# Patient Record
Sex: Male | Born: 1935 | Race: White | Hispanic: No | Marital: Married | Smoking: Former smoker
Health system: Southern US, Community
[De-identification: ages and names within clinical notes are randomized; demographics above are authoritative.]

## PROBLEM LIST (undated history)

## (undated) DIAGNOSIS — M1990 Unspecified osteoarthritis, unspecified site: Secondary | ICD-10-CM

## (undated) DIAGNOSIS — N486 Induration penis plastica: Secondary | ICD-10-CM

## (undated) DIAGNOSIS — K5792 Diverticulitis of intestine, part unspecified, without perforation or abscess without bleeding: Secondary | ICD-10-CM

## (undated) DIAGNOSIS — C61 Malignant neoplasm of prostate: Secondary | ICD-10-CM

## (undated) HISTORY — PX: PROSTATE BIOPSY: SHX241

## (undated) HISTORY — PX: SPINE SURGERY: SHX786

## (undated) HISTORY — PX: TONSILLECTOMY: SUR1361

## (undated) HISTORY — PX: OTHER SURGICAL HISTORY: SHX169

---

## 1999-12-08 ENCOUNTER — Encounter: Payer: Self-pay | Admitting: Gastroenterology

## 1999-12-08 ENCOUNTER — Encounter: Admission: RE | Admit: 1999-12-08 | Discharge: 1999-12-08 | Payer: Self-pay | Admitting: Gastroenterology

## 2004-01-27 HISTORY — PX: HERNIA REPAIR: SHX51

## 2004-02-19 ENCOUNTER — Ambulatory Visit (HOSPITAL_COMMUNITY): Admission: RE | Admit: 2004-02-19 | Discharge: 2004-02-19 | Payer: Self-pay | Admitting: Gastroenterology

## 2004-03-26 ENCOUNTER — Ambulatory Visit (HOSPITAL_BASED_OUTPATIENT_CLINIC_OR_DEPARTMENT_OTHER): Admission: RE | Admit: 2004-03-26 | Discharge: 2004-03-26 | Payer: Self-pay | Admitting: Surgery

## 2005-12-30 ENCOUNTER — Ambulatory Visit (HOSPITAL_COMMUNITY): Admission: RE | Admit: 2005-12-30 | Discharge: 2005-12-30 | Payer: Self-pay | Admitting: Neurosurgery

## 2008-02-02 ENCOUNTER — Encounter: Admission: RE | Admit: 2008-02-02 | Discharge: 2008-02-02 | Payer: Self-pay | Admitting: Gastroenterology

## 2008-12-21 ENCOUNTER — Encounter: Admission: RE | Admit: 2008-12-21 | Discharge: 2008-12-21 | Payer: Self-pay | Admitting: Sports Medicine

## 2009-02-11 ENCOUNTER — Encounter: Admission: RE | Admit: 2009-02-11 | Discharge: 2009-02-11 | Payer: Self-pay | Admitting: Sports Medicine

## 2009-05-02 ENCOUNTER — Encounter: Admission: RE | Admit: 2009-05-02 | Discharge: 2009-05-02 | Payer: Self-pay | Admitting: Sports Medicine

## 2009-09-16 ENCOUNTER — Encounter: Admission: RE | Admit: 2009-09-16 | Discharge: 2009-09-16 | Payer: Self-pay | Admitting: Sports Medicine

## 2010-06-13 NOTE — Op Note (Signed)
NAMEDAMONIE, ELLENWOOD NO.:  [id number]   MEDICAL RECORD NO.:  [id number]          PATIENT TYPE:  AMB   LOCATION:  DSC                          FACILITY:  [facility]   PHYSICIAN:  Thornton Park. Daphine Deutscher, MD  DATE OF BIRTH:  1935-03-25   DATE OF PROCEDURE:  03/26/2004  DATE OF DISCHARGE:                                 OPERATIVE REPORT   CCS #[id number]   PREOPERATIVE DIAGNOSIS:  Right inguinal hernia.   POSTOPERATIVE DIAGNOSIS:  Right indirect inguinal hernia.   OPERATION PERFORMED:  Right inguinal herniorrhaphy with mesh and high  ligation of sac.   SURGEON:  Thornton Park. Daphine Deutscher, MD   ANESTHESIA:  General.   DESCRIPTION OF PROCEDURE:  Arieh Bogue is a 75 year old gentleman taken to  room 3 at [facility] on the afternoon on March 26, 2004.  He was given general anesthesia.  The abdomen was clipped and prepped with  Betadine and draped sterilely.  An oblique incision was made and carried  down through the subcutaneous tissue to the external oblique which was  incised along the fibers to the external ring.  The cord was mobilized and a  Penrose placed about it.  I dissected proximally.  He had a lot of fat and a  lot of inflammation around his cord structures but I found anteromedially, a  fairly large indirect sac which I dissected from the cord structures and  performed a high ligation with running horizontal mattress sutures of silk.  I cut off the excess sac and allowed it to reduce.  The floor was repaired  with a piece of mesh, Marlex type polypropylene, cut to fit and sutured  along the inguinal ligament with a running 2-0 Prolene.  Medially it was  sutured with a 2-0 Prolene and then it was cut and split to create a new  internal ring suturing itself with horizontal mattresses of 2-0 Prolene.  These were then tucked beneath the external oblique.  While doing this, I  had excluded the ilioinguinal nerve branches.  Two were lifted and placed up  with  the superior flap.  I then allowed these to fall back down into this  region and then the external oblique was closed with running 2-0 Vicryl.  4-  0 Vicryl was used in the subcutaneous tissue in interrupted fashion. Then  the skin was approximated with running subcuticular 5-0 Vicryl with benzoin  and Steri-Strips.  The patient seemed to tolerate the procedure well.  He  was given Tylox for pain and will be followed up in the office in about  three weeks.   CONDITION:  Good.   FINAL DIAGNOSIS:  Right indirect inguinal hernia.      MBM/MEDQ  D:  03/26/2004  T:  03/27/2004  Job:  [id number]   cc:   Danise Edge, M.D.  [address]  [place]  [place] [address]  Fax: [phone]

## 2010-06-13 NOTE — Op Note (Signed)
Brian Hardy, MILLES NO.:  [id number]   MEDICAL RECORD NO.:  [id number]          PATIENT TYPE:  AMB   LOCATION:  ENDO                         FACILITY:  [facility]   PHYSICIAN:  Danise Edge, M.D.   DATE OF BIRTH:  01/21/1936   DATE OF PROCEDURE:  02/19/2004  DATE OF DISCHARGE:                                 OPERATIVE REPORT   PROCEDURE:  Screening colonoscopy.   PROCEDURE INDICATIONS:  Ms. Carle Fenech is a 75 year old male born August 24, 1935.  Mr. Driskill has a symptomatic right inguinal hernia which will  need surgical repair.  He has a history of diverticulitis.  His Jun 21, 1997  proctocolonoscopy to the cecum revealed extensive left colonic  diverticulosis without colorectal neoplasia.   MEDICATION ALLERGIES:  SULFA.   CHRONIC MEDICATIONS:  None.   PAST MEDICAL HISTORY:  Left colonic diverticulosis complicated by  diverticulitis.   PAST SURGICAL HISTORY:  None.   FAMILY HISTORY:  Negative for colon cancer and prostate cancer.   HABITS:  Mr. Jolliff quit smoking cigarettes 20 years ago.  He does not  consume alcohol to excess.   ENDOSCOPIST:  Danise Edge, M.D.   PREMEDICATION:  1.  Versed 7 mg.  2.  Demerol 60 mg.   PROCEDURE:  After obtaining informed consent, Mr. Watling was placed in the  left lateral decubitus position.  I administered intravenous Demerol and  intravenous Versed to achieve conscious sedation for the procedure.  The  patient's blood pressure, oxygen saturation, and cardiac rhythm were  monitored throughout the procedure and documented in the medical record.   Anal inspection and digital rectal exam were normal.  The prostate was  nonnodular. The Olympus adjustable pediatric colonoscope was introduced into  the rectum and advanced to the cecum.  Colonic preparation for the exam  today was excellent.   Rectum normal.   Sigmoid colon and descending colon.  Extensive left colonic diverticulosis.   Splenic flexure  normal.   Transverse colon normal.   Hepatic flexure normal.   Ascending colon normal.   Cecum and ileocecal valve normal.   ASSESSMENT:  Extensive left colonic diverticulosis; otherwise normal  screening proctocolonoscopy to the cecum.   RECOMMENDATIONS:  I will schedule Mr. Weil an appointment with Dr. Luretha Murphy to repair his symptomatic right inguinal hernia.      MJ/MEDQ  D:  02/19/2004  T:  02/19/2004  Job:  [id number]   cc:   Thornton Park Daphine Deutscher, MD  [address], [address]  [place]  [place] [address]  Fax: [phone]

## 2010-06-13 NOTE — Op Note (Signed)
Brian Hardy, Brian Hardy                ACCOUNT NO.:  [id number]   MEDICAL RECORD NO.:  [id number]          PATIENT TYPE:  AMB   LOCATION:  SDS                          FACILITY:  [facility]   PHYSICIAN:  Henry A. Pool, M.D.    DATE OF BIRTH:  [birth year]   DATE OF PROCEDURE:  12/30/2005  DATE OF DISCHARGE:                               OPERATIVE REPORT   PREOPERATIVE DIAGNOSIS:  Left L5-S1 herniated nucleus pulposus with  radiculopathy.   POSTOPERATIVE DIAGNOSIS:  Left L5-S1 herniated nucleus pulposus with  radiculopathy.   PROCEDURE NOTE:  Left L5-S1 laminotomy and microdiskectomy.   SURGEON:  Kathaleen Maser. Pool, M.D.   ASSISTANT:  Reinaldo Meeker, M.D.   ANESTHESIA:  General.   INDICATIONS:  [name] is a 75 year old male with history of severe  left lower extremity pain, paresthesias, weakness consistent left-sided  S1 radiculopathy.  The patient has failed conservative management.  Workup demonstrates evidence of a large left-sided L5-S1 disk herniation  with compression left-sided S1 nerve root.  The patient has decided to  proceed with lumbar microdiskectomy in hopes improving symptoms.   OPERATIVE NOTE:  Patient taken operating room placed on table in supine  position.  After adequate level of anesthesia was achieved, the patient  prone onto Wilson frame appropriately padded.  The patient lumbar region  prepped and draped sterilely.  A 10 blade used to make linear incision  overlying the L5-S1 interspace.  This carried down sharply in the  midline.  Subperiosteal section performed exposing lamina facet joints  L5-S1 on the left side.  Deep self-retaining retractor placed.  Intraoperative x-rays taken, level was confirmed.  Laminotomy then  performed using high-speed drill and Kerrison rongeurs to remove  inferior aspect lamina of L5, medial aspect of L5-S1 facet joint and the  superior rim of the S1 lamina.  Ligament flavum was elevated and  resected piecemeal fashion using  Kerrison rongeurs.  Underlying thecal  sac and exiting S1 nerve root were identified.  Microscope then brought  to field, used microdissection left side S1 nerve root, underlying disk  __________ epidural venous plexus coagulated and cut.  Left-sided S1  nerve root was gently mobilized, retracted towards midline.  A large  disk herniation with the inferior free fragment was encountered and  completely resected.  Disk space then incised 15 blade in rectangular  fashion.  Wide disk space clean-out was then achieved using pituitary  rongeurs, upward angled pituitary rongeurs and Epstein curettes.  All  elements the disk herniation completely resected.  All loose obviously  degenerative disk material removed from  the interspace.  This point a  very thorough decompression was achieved.  There is no evidence of  injury to thecal sac or nerve roots.  There is no evidence of any  residual disk herniation present.  Wound was then irrigated antibiotic  solution.  Gelfoam was placed topically for hemostasis, found to be  good.  Microscope  and retractor system were removed.  Hemostasis muscle achieved with  electrocautery. Wound was closed in layers with Vicryl suture.  Steri-  Strips, sterile dressings  were applied.  There no complications.  The  patient well and he returns to recovery room postoperatively.           ______________________________  Kathaleen Maser Pool, M.D.     HAP/MEDQ  D:  12/30/2005  T:  12/30/2005  Job:  [id number]

## 2010-09-03 ENCOUNTER — Other Ambulatory Visit: Payer: Self-pay | Admitting: Gastroenterology

## 2010-09-03 ENCOUNTER — Ambulatory Visit
Admission: RE | Admit: 2010-09-03 | Discharge: 2010-09-03 | Disposition: A | Discharge status: Home / Self Care | Payer: BC Managed Care – PPO | Source: Ambulatory Visit | Attending: Gastroenterology | Admitting: Gastroenterology

## 2010-10-02 ENCOUNTER — Ambulatory Visit (HOSPITAL_COMMUNITY)
Admission: RE | Admit: 2010-10-02 | Discharge: 2010-10-02 | Disposition: A | Discharge status: Home / Self Care | Payer: BC Managed Care – PPO | Source: Ambulatory Visit | Attending: Gastroenterology | Admitting: Gastroenterology

## 2010-10-02 ENCOUNTER — Other Ambulatory Visit: Payer: Self-pay | Admitting: Gastroenterology

## 2010-10-02 DIAGNOSIS — Z79899 Other long term (current) drug therapy: Secondary | ICD-10-CM | POA: Insufficient documentation

## 2010-10-02 DIAGNOSIS — R12 Heartburn: Secondary | ICD-10-CM | POA: Insufficient documentation

## 2010-10-02 DIAGNOSIS — N400 Enlarged prostate without lower urinary tract symptoms: Secondary | ICD-10-CM | POA: Insufficient documentation

## 2010-10-02 DIAGNOSIS — R1310 Dysphagia, unspecified: Secondary | ICD-10-CM | POA: Insufficient documentation

## 2010-10-05 NOTE — Op Note (Signed)
  NAMESAYER, MASINI NO.:  [id number]  MEDICAL RECORD NO.:  [id number]  LOCATION:  WLEN                         FACILITY:  [facility]  PHYSICIAN:  Danise Edge, M.D.   DATE OF BIRTH:  16-Jul-1935  DATE OF PROCEDURE:  10/02/2010 DATE OF DISCHARGE:                              OPERATIVE REPORT   PROCEDURE:  Diagnostic esophagogastroduodenoscopy.  HISTORY:  Mr. Brian Hardy is a 75 year old male born on 10/07/1935.  The patient has chronic, intermittent heartburn, and chronic, intermittent esophageal dysphagia.  He denies odynophagia, nausea, vomiting, or weight loss.  His barium esophagram with barium tablet was normal.  ENDOSCOPIST:  Danise Edge, MD  PREMEDICATION:  Fentanyl 75 mcg, Versed 5 mg.  PROCEDURE IN DETAIL:  The patient was placed in the left lateral decubitus position.  The Pentax gastroscope was passed through the posterior hypopharynx into the proximal esophagus without difficulty.The hypopharynx, larynx, and vocal cords appeared normal.  Esophagoscopy:  The proximal mid and lower segments of the esophageal mucosa appeared completely normal.  The squamocolumnar junction is noted at 40 cm from the incisor teeth.  There is no endoscopic evidence for the presence of erosive esophagitis or Barrett esophagus.  There is no esophageal obstruction or esophageal stricture formation.  Gastroscopy:  Retroflex view of the gastric cardia and fundus was normal.  The gastric body, antrum, and pylorus appeared normal.  Duodenoscopy:  The duodenal bulb and descending duodenum appeared normal.  Esophageal biopsies:  Random esophageal biopsies were performed from normal-appearing esophageal mucosa to look for eosinophilic esophagitis.  ASSESSMENT:  Normal esophagogastroduodenoscopy.  Esophageal biopsies to rule out eosinophilic esophagitis pending.         ______________________________ Danise Edge, M.D.    MJ/MEDQ  D:  10/02/2010  T:   10/02/2010  Job:  [id number]  Electronically Signed by Danise Edge M.D. on 10/05/2010 09:14:01 AM

## 2011-01-27 HISTORY — PX: INGUINAL HERNIA REPAIR: SUR1180

## 2011-01-29 DIAGNOSIS — N529 Male erectile dysfunction, unspecified: Secondary | ICD-10-CM | POA: Diagnosis not present

## 2011-01-29 DIAGNOSIS — N400 Enlarged prostate without lower urinary tract symptoms: Secondary | ICD-10-CM | POA: Diagnosis not present

## 2011-01-29 DIAGNOSIS — R972 Elevated prostate specific antigen [PSA]: Secondary | ICD-10-CM | POA: Diagnosis not present

## 2011-06-29 DIAGNOSIS — R1310 Dysphagia, unspecified: Secondary | ICD-10-CM | POA: Diagnosis not present

## 2011-10-20 DIAGNOSIS — Z23 Encounter for immunization: Secondary | ICD-10-CM | POA: Diagnosis not present

## 2011-11-12 DIAGNOSIS — R351 Nocturia: Secondary | ICD-10-CM | POA: Diagnosis not present

## 2011-12-10 DIAGNOSIS — R351 Nocturia: Secondary | ICD-10-CM | POA: Diagnosis not present

## 2011-12-10 DIAGNOSIS — K4090 Unilateral inguinal hernia, without obstruction or gangrene, not specified as recurrent: Secondary | ICD-10-CM | POA: Diagnosis not present

## 2011-12-10 DIAGNOSIS — R3913 Splitting of urinary stream: Secondary | ICD-10-CM | POA: Diagnosis not present

## 2011-12-22 ENCOUNTER — Ambulatory Visit (INDEPENDENT_AMBULATORY_CARE_PROVIDER_SITE_OTHER): Payer: BC Managed Care – PPO | Admitting: Surgery

## 2011-12-22 ENCOUNTER — Encounter (INDEPENDENT_AMBULATORY_CARE_PROVIDER_SITE_OTHER): Payer: Self-pay | Admitting: Surgery

## 2011-12-22 VITALS — BP 122/88 | HR 62 | Temp 97.6°F | Ht 67.0 in | Wt 152.2 lb

## 2011-12-22 DIAGNOSIS — K4090 Unilateral inguinal hernia, without obstruction or gangrene, not specified as recurrent: Secondary | ICD-10-CM | POA: Insufficient documentation

## 2011-12-22 NOTE — Progress Notes (Signed)
Patient ID: Brian Hardy, male   DOB: August 07, 1935, 76 y.o.   MRN: [id number]  Chief Complaint  Patient presents with  . Pre-op Exam    eval hernia    HPI Brian Hardy is a 76 y.o. male.   HPI This gentleman is a self referral. He has a symptomatic left inguinal hernia. He had a right inguinal hernia repair with mesh in 2006 here by Dr. Daphine Hardy. He has mild pain in the right groin. He has no nausea or vomiting. He reports that the hernia always easily reduces. He noticed it only about a week or so ago History reviewed. No pertinent past medical history.  Past Surgical History  Procedure Date  . Hernia repair 2006  . Spine surgery 2009-2010    Family History  Problem Relation Age of Onset  . Dementia Mother   . Pneumonia Father     Social History History  Substance Use Topics  . Smoking status: Former Smoker    Quit date: 12/21/1984  . Smokeless tobacco: Not on file  . Alcohol Use: Yes     Comment: social    Allergies  Allergen Reactions  . Sulfa Antibiotics Hives and Swelling    Current Outpatient Prescriptions  Medication Sig Dispense Refill  . finasteride (PROSCAR) 5 MG tablet         Review of Systems Review of Systems  Constitutional: Negative for fever, chills and unexpected weight change.  HENT: Negative for hearing loss, congestion, sore throat, trouble swallowing and voice change.   Eyes: Negative for visual disturbance.  Respiratory: Negative for cough and wheezing.   Cardiovascular: Negative for chest pain, palpitations and leg swelling.  Gastrointestinal: Negative for nausea, vomiting, abdominal pain, diarrhea, constipation, blood in stool, abdominal distention, anal bleeding and rectal pain.  Genitourinary: Negative for hematuria and difficulty urinating.  Musculoskeletal: Negative for arthralgias.  Skin: Negative for rash and wound.  Neurological: Negative for seizures, syncope, weakness and headaches.  Hematological: Negative for adenopathy.  Does not bruise/bleed easily.  Psychiatric/Behavioral: Negative for confusion.    Blood pressure 122/88, pulse 62, temperature 97.6 F (36.4 C), temperature source Temporal, height 5\' 7"  (1.702 m), weight 152 lb 3.2 oz (69.037 kg), SpO2 97.00%.  Physical Exam Physical Exam  Constitutional: He is oriented to person, place, and time. He appears well-developed and well-nourished. No distress.  HENT:  Head: Normocephalic and atraumatic.  Right Ear: External ear normal.  Left Ear: External ear normal.  Nose: Nose normal.  Mouth/Throat: Oropharynx is clear and moist. No oropharyngeal exudate.  Eyes: Conjunctivae normal are normal. Pupils are equal, round, and reactive to light. Right eye exhibits no discharge. Left eye exhibits no discharge. No scleral icterus.  Neck: Normal range of motion. Neck supple. No tracheal deviation present. No thyromegaly present.  Cardiovascular: Normal rate, regular rhythm, normal heart sounds and intact distal pulses.   No murmur heard. Pulmonary/Chest: Effort normal and breath sounds normal. No respiratory distress. He has no wheezes. He has no rales.  Abdominal: Soft. Bowel sounds are normal. He exhibits no distension. There is no tenderness.       There is a well-healed right inguinal incision with no evidence of recurrent hernia. There is a small, easily reducible left inguinal hernia  Musculoskeletal: Normal range of motion. He exhibits no edema and no tenderness.  Lymphadenopathy:    He has no cervical adenopathy.  Neurological: He is alert and oriented to person, place, and time.  Skin: Skin is warm and dry. No  rash noted. He is not diaphoretic. No erythema.  Psychiatric: His behavior is normal. Judgment normal.    Data Reviewed   Assessment    Left inguinal hernia    Plan    Repair with mesh was recommended. I discussed this with him in detail. I discussed the risks of surgery which includes but is not limited to bleeding, infection, nerve  entrapment, chronic pain, recurrence, etc. He understands and wishes to proceed. Surgery will be scheduled. Likelihood of success is good       Brian Hardy A 12/22/2011, 10:57 AM

## 2012-01-07 DIAGNOSIS — K4090 Unilateral inguinal hernia, without obstruction or gangrene, not specified as recurrent: Secondary | ICD-10-CM | POA: Diagnosis not present

## 2012-01-08 ENCOUNTER — Encounter (INDEPENDENT_AMBULATORY_CARE_PROVIDER_SITE_OTHER): Payer: Self-pay | Admitting: General Surgery

## 2012-02-01 ENCOUNTER — Encounter (INDEPENDENT_AMBULATORY_CARE_PROVIDER_SITE_OTHER): Payer: Self-pay | Admitting: Surgery

## 2012-02-01 ENCOUNTER — Ambulatory Visit (INDEPENDENT_AMBULATORY_CARE_PROVIDER_SITE_OTHER): Payer: BC Managed Care – PPO | Admitting: Surgery

## 2012-02-01 VITALS — BP 132/68 | HR 60 | Temp 97.7°F | Resp 12 | Ht 68.0 in | Wt 152.2 lb

## 2012-02-01 DIAGNOSIS — Z09 Encounter for follow-up examination after completed treatment for conditions other than malignant neoplasm: Secondary | ICD-10-CM

## 2012-02-01 NOTE — Progress Notes (Signed)
Subjective:     Patient ID: Brian Hardy, male   DOB: Feb 27, 1935, 76 y.o.   MRN: [id number]  HPI He is here for his first postoperative visit status post left inguinal hernia repair with mesh. He is doing well and has no complaints.  Review of Systems     Objective:   Physical Exam On exam, there is mild postoperative swelling but no evidence of infection or recurrence    Assessment:     Patient stable postop    Plan:     He will refrain from heavy lifting for one more week. I will see him back as needed

## 2012-02-08 DIAGNOSIS — N35919 Unspecified urethral stricture, male, unspecified site: Secondary | ICD-10-CM | POA: Diagnosis not present

## 2012-02-08 DIAGNOSIS — N486 Induration penis plastica: Secondary | ICD-10-CM | POA: Diagnosis not present

## 2012-02-08 DIAGNOSIS — N529 Male erectile dysfunction, unspecified: Secondary | ICD-10-CM | POA: Diagnosis not present

## 2012-02-11 ENCOUNTER — Encounter (INDEPENDENT_AMBULATORY_CARE_PROVIDER_SITE_OTHER): Payer: Self-pay | Admitting: General Surgery

## 2012-02-11 ENCOUNTER — Telehealth (INDEPENDENT_AMBULATORY_CARE_PROVIDER_SITE_OTHER): Payer: Self-pay | Admitting: General Surgery

## 2012-02-11 NOTE — Telephone Encounter (Signed)
RTW letter generated removing restrictions and FAX to pt.

## 2012-04-08 DIAGNOSIS — N35919 Unspecified urethral stricture, male, unspecified site: Secondary | ICD-10-CM | POA: Diagnosis not present

## 2012-04-08 DIAGNOSIS — N486 Induration penis plastica: Secondary | ICD-10-CM | POA: Diagnosis not present

## 2012-06-12 ENCOUNTER — Other Ambulatory Visit (INDEPENDENT_AMBULATORY_CARE_PROVIDER_SITE_OTHER): Payer: Self-pay | Admitting: Surgery

## 2012-06-15 DIAGNOSIS — N35919 Unspecified urethral stricture, male, unspecified site: Secondary | ICD-10-CM | POA: Diagnosis not present

## 2012-06-15 DIAGNOSIS — N529 Male erectile dysfunction, unspecified: Secondary | ICD-10-CM | POA: Diagnosis not present

## 2012-06-15 DIAGNOSIS — N486 Induration penis plastica: Secondary | ICD-10-CM | POA: Diagnosis not present

## 2012-07-27 DIAGNOSIS — N400 Enlarged prostate without lower urinary tract symptoms: Secondary | ICD-10-CM | POA: Diagnosis not present

## 2012-11-13 DIAGNOSIS — Z23 Encounter for immunization: Secondary | ICD-10-CM | POA: Diagnosis not present

## 2013-06-21 DIAGNOSIS — N486 Induration penis plastica: Secondary | ICD-10-CM | POA: Diagnosis not present

## 2013-06-21 DIAGNOSIS — R972 Elevated prostate specific antigen [PSA]: Secondary | ICD-10-CM | POA: Diagnosis not present

## 2013-06-21 DIAGNOSIS — N400 Enlarged prostate without lower urinary tract symptoms: Secondary | ICD-10-CM | POA: Diagnosis not present

## 2013-08-09 ENCOUNTER — Other Ambulatory Visit: Payer: Self-pay | Admitting: Gastroenterology

## 2013-09-14 ENCOUNTER — Encounter (HOSPITAL_COMMUNITY): Payer: Self-pay | Admitting: Pharmacy Technician

## 2013-09-22 ENCOUNTER — Encounter (HOSPITAL_COMMUNITY): Payer: Self-pay | Admitting: *Deleted

## 2013-10-02 NOTE — Anesthesia Preprocedure Evaluation (Addendum)
Anesthesia Evaluation  Patient identified by MRN, date of birth, ID band Patient awake    Reviewed: Allergy & Precautions, H&P , NPO status , Patient's Chart, lab work & pertinent test results  History of Anesthesia Complications Negative for: history of anesthetic complications  Airway Mallampati: II TM Distance: >3 FB Neck ROM: full    Dental no notable dental hx.    Pulmonary former smoker,  breath sounds clear to auscultation  Pulmonary exam normal       Cardiovascular Exercise Tolerance: Good negative cardio ROS  Rhythm:regular Rate:Normal     Neuro/Psych negative neurological ROS  negative psych ROS   GI/Hepatic negative GI ROS, Neg liver ROS,   Endo/Other  negative endocrine ROS  Renal/GU negative Renal ROS  negative genitourinary   Musculoskeletal   Abdominal   Peds  Hematology negative hematology ROS (+)   Anesthesia Other Findings   Reproductive/Obstetrics negative OB ROS                          Anesthesia Physical Anesthesia Plan  ASA: II  Anesthesia Plan: MAC   Post-op Pain Management:    Induction: Intravenous  Airway Management Planned:   Additional Equipment:   Intra-op Plan:   Post-operative Plan:   Informed Consent: I have reviewed the patients History and Physical, chart, labs and discussed the procedure including the risks, benefits and alternatives for the proposed anesthesia with the patient or authorized representative who has indicated his/her understanding and acceptance.   Dental Advisory Given and Dental advisory given  Plan Discussed with: CRNA  Anesthesia Plan Comments:        Anesthesia Quick Evaluation

## 2013-10-03 ENCOUNTER — Ambulatory Visit (HOSPITAL_COMMUNITY): Payer: BC Managed Care – PPO | Admitting: Anesthesiology

## 2013-10-03 ENCOUNTER — Encounter (HOSPITAL_COMMUNITY): Payer: BC Managed Care – PPO | Admitting: Anesthesiology

## 2013-10-03 ENCOUNTER — Ambulatory Visit (HOSPITAL_COMMUNITY)
Admission: RE | Admit: 2013-10-03 | Discharge: 2013-10-03 | Disposition: A | Discharge status: Home / Self Care | Payer: BC Managed Care – PPO | Source: Ambulatory Visit | Attending: Gastroenterology | Admitting: Gastroenterology

## 2013-10-03 ENCOUNTER — Encounter (HOSPITAL_COMMUNITY): Payer: Self-pay | Admitting: *Deleted

## 2013-10-03 ENCOUNTER — Encounter (HOSPITAL_COMMUNITY)
Admission: RE | Disposition: A | Discharge status: Home / Self Care | Payer: Self-pay | Source: Ambulatory Visit | Attending: Gastroenterology

## 2013-10-03 DIAGNOSIS — Z882 Allergy status to sulfonamides status: Secondary | ICD-10-CM | POA: Insufficient documentation

## 2013-10-03 DIAGNOSIS — K219 Gastro-esophageal reflux disease without esophagitis: Secondary | ICD-10-CM | POA: Diagnosis not present

## 2013-10-03 DIAGNOSIS — K5730 Diverticulosis of large intestine without perforation or abscess without bleeding: Secondary | ICD-10-CM | POA: Insufficient documentation

## 2013-10-03 DIAGNOSIS — Z8719 Personal history of other diseases of the digestive system: Secondary | ICD-10-CM | POA: Diagnosis not present

## 2013-10-03 DIAGNOSIS — K7460 Unspecified cirrhosis of liver: Secondary | ICD-10-CM | POA: Insufficient documentation

## 2013-10-03 DIAGNOSIS — Z1211 Encounter for screening for malignant neoplasm of colon: Secondary | ICD-10-CM | POA: Insufficient documentation

## 2013-10-03 DIAGNOSIS — Z888 Allergy status to other drugs, medicaments and biological substances status: Secondary | ICD-10-CM | POA: Diagnosis not present

## 2013-10-03 DIAGNOSIS — N400 Enlarged prostate without lower urinary tract symptoms: Secondary | ICD-10-CM | POA: Diagnosis not present

## 2013-10-03 HISTORY — PX: COLONOSCOPY WITH PROPOFOL: SHX5780

## 2013-10-03 SURGERY — COLONOSCOPY WITH PROPOFOL
Anesthesia: Monitor Anesthesia Care

## 2013-10-03 MED ORDER — PROPOFOL 10 MG/ML IV BOLUS
INTRAVENOUS | Status: AC
  Filled 2013-10-03: qty 20

## 2013-10-03 MED ORDER — MEPERIDINE HCL 100 MG/ML IJ SOLN: 6.2500 mg | INTRAMUSCULAR | Status: DC | PRN

## 2013-10-03 MED ORDER — LACTATED RINGERS IV SOLN
INTRAVENOUS | Status: DC
  Administered 2013-10-03: 10:00:00 via INTRAVENOUS

## 2013-10-03 MED ORDER — SODIUM CHLORIDE 0.9 % IV SOLN: INTRAVENOUS | Status: DC

## 2013-10-03 MED ORDER — PROPOFOL 10 MG/ML IV BOLUS
INTRAVENOUS | Status: DC | PRN
  Administered 2013-10-03 (×2): 50 mg via INTRAVENOUS
  Administered 2013-10-03: 100 mg via INTRAVENOUS
  Administered 2013-10-03 (×2): 50 mg via INTRAVENOUS

## 2013-10-03 MED ORDER — PROMETHAZINE HCL 25 MG/ML IJ SOLN: 6.2500 mg | INTRAMUSCULAR | Status: DC | PRN

## 2013-10-03 MED ORDER — ACETAMINOPHEN 325 MG PO TABS
325.0000 mg | ORAL_TABLET | ORAL | Status: DC | PRN
  Filled 2013-10-03: qty 2

## 2013-10-03 MED ORDER — ACETAMINOPHEN 160 MG/5ML PO SOLN
325.0000 mg | ORAL | Status: DC | PRN
  Filled 2013-10-03: qty 20.3

## 2013-10-03 SURGICAL SUPPLY — 22 items

## 2013-10-03 NOTE — Anesthesia Postprocedure Evaluation (Signed)
  Anesthesia Post-op Note  Patient: Brian Hardy  Procedure(s) Performed: Procedure(s) (LRB): COLONOSCOPY WITH PROPOFOL (N/A)  Patient Location: PACU  Anesthesia Type: MAC  Level of Consciousness: awake and alert   Airway and Oxygen Therapy: Patient Spontanous Breathing  Post-op Pain: mild  Post-op Assessment: Post-op Vital signs reviewed, Patient's Cardiovascular Status Stable, Respiratory Function Stable, Patent Airway and No signs of Nausea or vomiting  Last Vitals:  Filed Vitals:   10/03/13 1106  BP: 128/79  Temp:   Resp: 20    Post-op Vital Signs: stable   Complications: No apparent anesthesia complications

## 2013-10-03 NOTE — Op Note (Signed)
Procedure: Screening colonoscopy. Normal screening colonoscopies performed in 1999 and 2006. Normal flexible proctosigmoidoscopy followed by air contrast barium enema performed in September 2010.  Endoscopist: Earle Gell  Premedication: Propofol administered by anesthesia  Procedure: The patient was placed in the left lateral decubitus position. Anal inspection and digital rectal exam were normal. The patient has benign prostatic hypertrophy. The Pentax pediatric colonoscope was introduced into the rectum and advanced to the cecum. A normal-appearing appendiceal orifice was identified. A normal-appearing ileocecal valve was identified. Performance of today's screening colonoscopy was technically very difficult due to left colonic loop formation and extensive left colonic diverticulosis. Colonic preparation for the exam today was good.  Rectum. Normal. Retroflexed view of the distal rectum normal  Sigmoid colon and descending colon. Extensive left colonic diverticulosis  Splenic flexure. Normal  Transverse colon. Normal  Hepatic flexure. Normal  Ascending colon. Normal  Cecum and ileocecal valve. Normal  Assessment: Normal screening colonoscopy.  Recommendation: Repeat screening colonoscopy is not recommended

## 2013-10-03 NOTE — Transfer of Care (Signed)
Immediate Anesthesia Transfer of Care Note  Patient: Brian Hardy  Procedure(s) Performed: Procedure(s) (LRB): COLONOSCOPY WITH PROPOFOL (N/A)  Patient Location: PACU  Anesthesia Type: MAC  Level of Consciousness: sedated, patient cooperative and responds to stimulation  Airway & Oxygen Therapy: Patient Spontanous Breathing and Patient connected to face mask oxgen  Post-op Assessment: Report given to PACU RN and Post -op Vital signs reviewed and stable  Post vital signs: Reviewed and stable  Complications: No apparent anesthesia complications

## 2013-10-03 NOTE — H&P (Signed)
  Procedure: Screening colonoscopy. Normal screening colonoscopies performed in 1999 and 2006. Normal flexible proctosigmoidoscopy followed by air contrast barium enema following a bout of acute sigmoid diverticulitis performed in September 2010. Normal esophagogastroduodenoscopy with esophageal biopsies performed in September 2012.  History: The patient is a 78 year old male born 1935-05-18. He is scheduled to undergo a screening colonoscopy with polypectomy to prevent colon cancer. He underwent a normal flexible proctosigmoidoscopy followed by air contrast barium enema in September 2010 following a bout of acute sigmoid diverticulitis.  Medication allergies: Sulfa drugs. Metronidazole.  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 Right inguinal herniorrhaphy. Benign prostatic hypertrophy. Lumbar disc surgery. Gastroesophageal reflux associated with normal esophagogastroduodenoscopy with esophageal biopsies.  Exam: The patient is alert and lying comfortably on the endoscopy stretcher. Abdomen is soft and nontender to palpation. Lungs are clear to auscultation. Cardiac exam reveals a regular rhythm  Plan: Proceed with screening colonoscopy

## 2013-10-03 NOTE — Discharge Instructions (Signed)
Colonoscopy, Care After °These instructions give you information on caring for yourself after your procedure. Your doctor may also give you more specific instructions. Call your doctor if you have any problems or questions after your procedure. °HOME CARE °· Do not drive for 24 hours. °· Do not sign important papers or use machinery for 24 hours. °· You may shower. °· You may go back to your usual activities, but go slower for the first 24 hours. °· Take rest breaks often during the first 24 hours. °· Walk around or use warm packs on your belly (abdomen) if you have belly cramping or gas. °· Drink enough fluids to keep your pee (urine) clear or pale yellow. °· Resume your normal diet. Avoid heavy or fried foods. °· Avoid drinking alcohol for 24 hours or as told by your doctor. °· Only take medicines as told by your doctor. °If a tissue sample (biopsy) was taken during the procedure:  °· Do not take aspirin or blood thinners for 7 days, or as told by your doctor. °· Do not drink alcohol for 7 days, or as told by your doctor. °· Eat soft foods for the first 24 hours. °GET HELP IF: °You still have a small amount of blood in your poop (stool) 2-3 days after the procedure. °GET HELP RIGHT AWAY IF: °· You have more than a small amount of blood in your poop. °· You see clumps of tissue (blood clots) in your poop. °· Your belly is puffy (swollen). °· You feel sick to your stomach (nauseous) or throw up (vomit). °· You have a fever. °· You have belly pain that gets worse and medicine does not help. °MAKE SURE YOU: °· Understand these instructions. °· Will watch your condition. °· Will get help right away if you are not doing well or get worse. °Document Released: 02/14/2010 Document Revised: 01/17/2013 Document Reviewed: 09/19/2012 °[employer]® Patient Information ©2015 [employer]. This information is not intended to replace advice given to you by your health care provider. Make sure you discuss any questions you have with  your health care provider. ° °

## 2013-10-04 ENCOUNTER — Encounter (HOSPITAL_COMMUNITY): Payer: Self-pay | Admitting: Gastroenterology

## 2014-08-22 DIAGNOSIS — N400 Enlarged prostate without lower urinary tract symptoms: Secondary | ICD-10-CM | POA: Diagnosis not present

## 2014-08-22 DIAGNOSIS — Z23 Encounter for immunization: Secondary | ICD-10-CM | POA: Diagnosis not present

## 2015-03-18 ENCOUNTER — Encounter: Payer: Self-pay | Admitting: Sports Medicine

## 2015-03-18 ENCOUNTER — Ambulatory Visit (INDEPENDENT_AMBULATORY_CARE_PROVIDER_SITE_OTHER): Payer: BC Managed Care – PPO | Admitting: Sports Medicine

## 2015-03-18 VITALS — BP 140/76 | Ht 68.0 in | Wt 150.0 lb

## 2015-03-18 DIAGNOSIS — M545 Low back pain, unspecified: Secondary | ICD-10-CM

## 2015-03-18 MED ORDER — TRAMADOL HCL 50 MG PO TABS: ORAL_TABLET | ORAL | Status: DC

## 2015-03-18 NOTE — Progress Notes (Signed)
  Brian Hardy - 80 y.o. male MRN [id number]  Date of birth: 08-06-1935  SUBJECTIVE:  Including CC & ROS.  Pt presents with 10 years of low back pain. Pt reports that initially he was having pain mostly in his left leg due to a herniated disc. He had a microdiscectomy for this and ever since has had worsening low back pain ever since. He says his pain is worse with any forward bending activity like crouching down or sweeping/raking. It is worse with sitting than standing and feels quite stiff and sore when he gets up after sitting for awhile. He sometimes takes otc nsaids but they do not help. He had epidural CSI in the past and had instant relief the first time that lasted 30 days. Subsequent injections were ineffective, both epidural and facet.    HISTORY: Past Medical, Surgical, Social, and Family History Reviewed & Updated per EMR.   Pertinent Historical Findings include: Lumbar microdiscectomy 2007  DATA REVIEWED: Microdiscectomy, epidural injections and facet joint injections 2007-2011, records in [place]:  VS: BP:140/76 mmHg  HR: bpm  TEMP: ( )  RESP:   HT:5\' 8"  (172.7 cm)   WT:150 lb (68.04 kg)  BMI:22.9 PHYSICAL EXAM:  Back - Normal skin, Spine with normal alignment and no deformity.  No tenderness to vertebral process palpation.  Paraspinous muscles are not tender and without spasm.   Range of motion is full at neck and lumbar sacral regions. Significant pain when extending from flexed lumbar position. Pain with back extension not as pronounced.  Neurological exam: Mild atrophy in both calves but it is symmetric and strength is well-preserved in both lower extremities.  ASSESSMENT & PLAN: See problem based charting & AVS for pt instructions. Midline low back pain without sciatica 10 years of pain, worsening since microdiscectomy for herniated disc with L leg radiculopathy. Pain worse with sitting, bending forward. No radiation. No tenderness. Numb in part of left  foot ever since surgery, otherwise no nerve symptoms. - Get lumbar xray and MRI to evaluate further. Likely DDD with possible facet component. - may benefit from epidural injections as these have helped some in the past - unlikely to benefit from surgery given age and limited options at the point (fusion) - trial of tramadol for symptom management - may also get better after retirement as his job is fairly strenuous (planning to retire around June) - will call with imaging results and discuss treatment options at that point   Patient seen and evaluated with the resident. I agree with the above plan of care. I will get updated images of his lumbar spine including x-rays and an MRI in anticipation of referral for repeat epidural steroid injections. He has had these in the past. He knows that the initial injection was very helpful but repeat injections were not quite as beneficial. I've also given him meloxicam to use at night for pain and I've given him a pair of green sports insoles for additional cushioning in his work shoes. I will call the patient after I reviewed his x-rays and his MRI at which point we will delineate further treatment.

## 2015-03-18 NOTE — Assessment & Plan Note (Addendum)
10 years of pain, worsening since microdiscectomy for herniated disc with L leg radiculopathy. Pain worse with sitting, bending forward. No radiation. No tenderness. Numb in part of left foot ever since surgery, otherwise no nerve symptoms. - Get lumbar xray and MRI to evaluate further. Likely DDD with possible facet component. - may benefit from epidural injections as these have helped some in the past - unlikely to benefit from surgery given age and limited options at the point (fusion) - trial of tramadol for symptom management - may also get better after retirement as his job is fairly strenuous (planning to retire around June) - will call with imaging results and discuss treatment options at that point

## 2015-03-20 ENCOUNTER — Ambulatory Visit
Admission: RE | Admit: 2015-03-20 | Discharge: 2015-03-20 | Disposition: A | Discharge status: Home / Self Care | Payer: BC Managed Care – PPO | Source: Ambulatory Visit | Attending: Sports Medicine | Admitting: Sports Medicine

## 2015-03-20 DIAGNOSIS — M545 Low back pain, unspecified: Secondary | ICD-10-CM

## 2015-04-01 ENCOUNTER — Ambulatory Visit
Admission: RE | Admit: 2015-04-01 | Discharge: 2015-04-01 | Disposition: A | Discharge status: Home / Self Care | Payer: BC Managed Care – PPO | Source: Ambulatory Visit | Attending: Sports Medicine | Admitting: Sports Medicine

## 2015-04-01 DIAGNOSIS — M545 Low back pain, unspecified: Secondary | ICD-10-CM

## 2015-04-01 DIAGNOSIS — M4806 Spinal stenosis, lumbar region: Secondary | ICD-10-CM | POA: Diagnosis not present

## 2015-04-03 ENCOUNTER — Telehealth: Payer: Self-pay | Admitting: Sports Medicine

## 2015-04-03 NOTE — Telephone Encounter (Signed)
I spoke with the patient on the phone today after reviewing an updated MRI of his lumbar spine. There has been definite progression of the spondylosis at L4-L5 where there is new 0.4 cm anterior listhesis due to facet arthropathy which results in moderate central canal narrowing at this level. Patient is still having pain despite tramadol and meloxicam. He has also undergone facet injections and epidural injections in the past with limited benefit. I think that the patient needs to meet with Dr. Sherley Bounds to discuss possible surgical options. Dr. Ronnald Ramp may also recommend repeating either the epidural injections or the facet injections but I'm sure he would want to do those in his office so that he could follow Mr. Eavey progress. Mr. Talati is encouraged to touch base with me once again after his appointment with Dr. Ronnald Ramp if he has any further questions. Otherwise, I will defer further workup and treatment to the discretion of Dr. Ronnald Ramp.

## 2015-04-09 ENCOUNTER — Other Ambulatory Visit: Payer: Self-pay | Admitting: *Deleted

## 2015-04-09 DIAGNOSIS — M545 Low back pain, unspecified: Secondary | ICD-10-CM

## 2015-04-18 ENCOUNTER — Other Ambulatory Visit: Payer: Self-pay | Admitting: Gastroenterology

## 2015-04-18 DIAGNOSIS — R1310 Dysphagia, unspecified: Secondary | ICD-10-CM

## 2015-04-18 DIAGNOSIS — R12 Heartburn: Secondary | ICD-10-CM | POA: Diagnosis not present

## 2015-04-18 DIAGNOSIS — R1314 Dysphagia, pharyngoesophageal phase: Secondary | ICD-10-CM | POA: Diagnosis not present

## 2015-04-22 ENCOUNTER — Other Ambulatory Visit: Payer: Self-pay | Admitting: Neurological Surgery

## 2015-04-22 DIAGNOSIS — M4316 Spondylolisthesis, lumbar region: Secondary | ICD-10-CM | POA: Diagnosis not present

## 2015-04-22 DIAGNOSIS — R030 Elevated blood-pressure reading, without diagnosis of hypertension: Secondary | ICD-10-CM | POA: Diagnosis not present

## 2015-04-23 ENCOUNTER — Ambulatory Visit
Admission: RE | Admit: 2015-04-23 | Discharge: 2015-04-23 | Disposition: A | Discharge status: Home / Self Care | Payer: BC Managed Care – PPO | Source: Ambulatory Visit | Attending: Gastroenterology | Admitting: Gastroenterology

## 2015-04-23 DIAGNOSIS — R1310 Dysphagia, unspecified: Secondary | ICD-10-CM

## 2015-05-08 ENCOUNTER — Other Ambulatory Visit: Payer: Self-pay | Admitting: Gastroenterology

## 2015-05-08 DIAGNOSIS — R1013 Epigastric pain: Secondary | ICD-10-CM

## 2015-05-17 ENCOUNTER — Ambulatory Visit
Admission: RE | Admit: 2015-05-17 | Discharge: 2015-05-17 | Disposition: A | Discharge status: Home / Self Care | Payer: BC Managed Care – PPO | Source: Ambulatory Visit | Attending: Gastroenterology | Admitting: Gastroenterology

## 2015-05-17 DIAGNOSIS — R1013 Epigastric pain: Secondary | ICD-10-CM

## 2015-05-22 ENCOUNTER — Inpatient Hospital Stay (HOSPITAL_COMMUNITY): Admission: RE | Admit: 2015-05-22 | Payer: BC Managed Care – PPO | Source: Ambulatory Visit

## 2015-05-23 DIAGNOSIS — M4316 Spondylolisthesis, lumbar region: Secondary | ICD-10-CM | POA: Diagnosis not present

## 2015-05-30 ENCOUNTER — Inpatient Hospital Stay (HOSPITAL_COMMUNITY)
Admission: RE | Admit: 2015-05-30 | Payer: BC Managed Care – PPO | Source: Ambulatory Visit | Admitting: Neurological Surgery

## 2015-05-30 ENCOUNTER — Encounter (HOSPITAL_COMMUNITY): Admission: RE | Payer: Self-pay | Source: Ambulatory Visit

## 2015-05-30 DIAGNOSIS — M4316 Spondylolisthesis, lumbar region: Secondary | ICD-10-CM | POA: Diagnosis not present

## 2015-05-30 SURGERY — FOR MAXIMUM ACCESS (MAS) POSTERIOR LUMBAR INTERBODY FUSION (PLIF) 1 LEVEL
Anesthesia: General | Site: Back

## 2015-06-06 DIAGNOSIS — M4316 Spondylolisthesis, lumbar region: Secondary | ICD-10-CM | POA: Diagnosis not present

## 2015-06-13 ENCOUNTER — Encounter: Payer: Self-pay | Admitting: Sports Medicine

## 2015-06-13 ENCOUNTER — Ambulatory Visit (INDEPENDENT_AMBULATORY_CARE_PROVIDER_SITE_OTHER): Payer: BC Managed Care – PPO | Admitting: Sports Medicine

## 2015-06-13 VITALS — BP 147/74 | Ht 67.0 in | Wt 154.0 lb

## 2015-06-13 DIAGNOSIS — M4316 Spondylolisthesis, lumbar region: Secondary | ICD-10-CM | POA: Diagnosis not present

## 2015-06-13 DIAGNOSIS — M25511 Pain in right shoulder: Secondary | ICD-10-CM | POA: Diagnosis not present

## 2015-06-13 DIAGNOSIS — M25512 Pain in left shoulder: Secondary | ICD-10-CM | POA: Diagnosis not present

## 2015-06-13 MED ORDER — METHYLPREDNISOLONE ACETATE 40 MG/ML IJ SUSP
40.0000 mg | Freq: Once | INTRAMUSCULAR | Status: AC
  Administered 2015-06-13: 40 mg via INTRA_ARTICULAR

## 2015-06-13 NOTE — Progress Notes (Signed)
Subjective: Brian Hardy is a 80 y.o. male with a history of L4-5 spondylosis with anterolisthesis presenting for neck and shoulder pain.   Mr. Langford reports several weeks of gradually worsening intermittent neck pain that includes pain in shoulders and upper arms. This is worse with overhead motion of the arms. The aching pain also radiates up to the base of his head, which he initially attributed to a change in pillow. NSAIDs and tramadol have provided limited improvement in pain. Denies injury/trauma. He has been undergoing physical therapy as required by his insurance prior to low back surgery with no real improvement in symptoms.   - ROS: No numbness or tingling, or shooting pain into lower arms. No weakness.  - Not diabetic  Objective: BP 147/74 mmHg  Ht 5\' 7"  (1.702 m)  Wt 154 lb (69.854 kg)  BMI 24.11 kg/m2 Gen: Pleasant, well-appearing 80 y.o. male in no distress Neck: Cervical spine demonstrates decreased range of motion in all planes. He is tender to palpation over the T1 vertebrae but not markedly so. Diffuse tenderness to palpation along the paraspinal musculature. Shoulder: Patient demonstrates full range of motion with a positive painful arc bilaterally. Positive empty can, positive Hawkins. Rotator cuff strength is 5/5 bilaterally but he does have reproducible pain with resisted supra space and external rotation. No tenderness over the acromioclavicular joint. There is no gross neurological deficit of either upper extremity.  Assessment/Plan:  1. Neck pain likely secondary to cervical degenerative disc disease 2. Bilateral shoulder pain likely secondary to rotator cuff tendinopathy 3. Lumbar spondylosis-currently undergoing physical therapy and followed by neurosurgery  For diagnostic as well as therapeutic reasons, I elected to inject each of his subacromial spaces with cortisone today. Patient tolerated this without difficulty. He will follow-up with me in 3-4 weeks for  reevaluation. We will see how much of his pain resolves with today's injections. I have no doubt that he also has some cervical degenerative disc disease, and if his neck pain persists at follow-up, and symptoms warrant, I would consider initial imaging in the form of an x-ray. For his lumbar spondylosis, he has failed conservative treatment to date and is followed by neurosurgery. His insurance is requiring him to fail physical therapy prior to pursuing surgery. He will follow-up with Dr. Ronnald Ramp as scheduled.  Consent obtained and verified. Time-out conducted. Noted no overlying erythema, induration, or other signs of local infection. Skin prepped in a sterile fashion. Topical analgesic spray: Ethyl chloride. Joint: right shoulder (subacromial) Needle: 25g 1.5 inch Completed without difficulty. Meds: 3cc 1% xylocaine, 1cc (40mg ) depomedrol  Advised to call if fevers/chills, erythema, induration, drainage, or persistent bleeding.  Consent obtained and verified. Time-out conducted. Noted no overlying erythema, induration, or other signs of local infection. Skin prepped in a sterile fashion. Topical analgesic spray: Ethyl chloride. Joint: left shoulder (subacromial) Needle: 25g 1.5 inch Completed without difficulty. Meds: 3cc 1% xylocaine, 1cc (40mg ) depomedrol  Advised to call if fevers/chills, erythema, induration, drainage, or persistent bleeding.

## 2015-06-18 DIAGNOSIS — M4316 Spondylolisthesis, lumbar region: Secondary | ICD-10-CM | POA: Diagnosis not present

## 2015-06-20 DIAGNOSIS — M4316 Spondylolisthesis, lumbar region: Secondary | ICD-10-CM | POA: Diagnosis not present

## 2015-06-25 DIAGNOSIS — M4316 Spondylolisthesis, lumbar region: Secondary | ICD-10-CM | POA: Diagnosis not present

## 2015-07-03 ENCOUNTER — Encounter: Payer: Self-pay | Admitting: Sports Medicine

## 2015-07-03 ENCOUNTER — Ambulatory Visit (INDEPENDENT_AMBULATORY_CARE_PROVIDER_SITE_OTHER): Payer: BC Managed Care – PPO | Admitting: Sports Medicine

## 2015-07-03 VITALS — BP 147/69 | HR 63 | Ht 68.0 in | Wt 154.0 lb

## 2015-07-03 DIAGNOSIS — M25512 Pain in left shoulder: Secondary | ICD-10-CM | POA: Diagnosis not present

## 2015-07-03 DIAGNOSIS — M25511 Pain in right shoulder: Secondary | ICD-10-CM | POA: Diagnosis not present

## 2015-07-03 MED ORDER — DICLOFENAC SODIUM 75 MG PO TBEC: DELAYED_RELEASE_TABLET | ORAL | Status: DC

## 2015-07-03 NOTE — Progress Notes (Signed)
  Subjective:   Brian Hardy is a 80 y.o. male with a history of L4-5 spondylosis with anterolisthesis here for f/u of neck and shoulder pain  Patient reports 7-10 day improvement in shoulder and neck pain after b/l subacromial cortisone injections on 06/13/15.  He is now back to same pain level as previously.  Pain is in posterior neck, and bilateral shoulders.  It is worse in the mornings and improves with 2 Aleve qAM.  He has intermittent numbness in ulnar distribution of R hand, not present currently.   Review of Systems:  Per HPI. Denies weakness, fevers, erythema. Not diabetic  Social History: former smoker  Objective:  BP 147/69 mmHg  Pulse 63  Ht 5\' 8"  (1.727 m)  Wt 154 lb (69.854 kg)  BMI 23.42 kg/m2  Gen:  80 y.o. male in NAD, pleasant and well appearing Neck: Cervical spine demonstrates decreased range of motion in all planes. He is tender to palpation over the C5 vertebrae but not markedly so. Diffuse tenderness to palpation along the paraspinal musculature. Negative Spurlings. Shoulder: Patient demonstrates full range of motion with a positive painful arc bilaterally. Positive empty can, positive Hawkins. Rotator cuff strength is 5/5 bilaterally but he does have reproducible pain with resisted supraspinatus and external rotation. No tenderness over the acromioclavicular joint. There is no gross neurological deficit of either upper extremity.     Assessment & Plan:     Brian Hardy is a 80 y.o. male here for   1. Neck pain likely secondary to cervical degenerative disc disease 2. Bilateral shoulder pain likely secondary to rotator cuff tendinopathy 3. Lumbar spondylosis- s/p physical therapy and followed by neurosurgery  Given some improvement with cortisone injections at last visit, likely to have rotator cuff tendinopathy and impingement in addition to likely cervicxal degenerative disc disease.  Given intact strength and mobility, recommend trial of NSAIDs prior to  more invasive measures.  Voltaren BID. F/u prn.  Virginia Crews, MD MPH PGY-2,  [facility] 07/03/2015  8:54 AM     Patient seen and evaluated with the resident. I agree with the above plan of care. We will try the patient on some Voltaren twice daily as needed for pain. If he does not find this medication to be helpful, he will contact me and I will switch him to a different class of NSAIDs. He will discontinue his Aleve. Follow-up with neurosurgery per their discretion. Follow-up with me as needed.

## 2015-07-25 ENCOUNTER — Telehealth: Payer: Self-pay | Admitting: *Deleted

## 2015-07-25 ENCOUNTER — Other Ambulatory Visit: Payer: Self-pay | Admitting: *Deleted

## 2015-07-25 MED ORDER — NAPROXEN 500 MG PO TABS: 500.0000 mg | ORAL_TABLET | Freq: Two times a day (BID) | ORAL | Status: DC | PRN

## 2015-07-25 NOTE — Telephone Encounter (Signed)
Patient called and said the voltaren wasn't really helping his pain.  Switched him to naprosyn 500mg  to see if this would help better.

## 2015-08-22 DIAGNOSIS — M5030 Other cervical disc degeneration, unspecified cervical region: Secondary | ICD-10-CM | POA: Diagnosis not present

## 2015-08-22 DIAGNOSIS — M75102 Unspecified rotator cuff tear or rupture of left shoulder, not specified as traumatic: Secondary | ICD-10-CM | POA: Diagnosis not present

## 2015-08-22 DIAGNOSIS — M75101 Unspecified rotator cuff tear or rupture of right shoulder, not specified as traumatic: Secondary | ICD-10-CM | POA: Diagnosis not present

## 2015-08-28 DIAGNOSIS — N400 Enlarged prostate without lower urinary tract symptoms: Secondary | ICD-10-CM | POA: Diagnosis not present

## 2015-09-09 DIAGNOSIS — M542 Cervicalgia: Secondary | ICD-10-CM | POA: Diagnosis not present

## 2015-09-17 DIAGNOSIS — M25511 Pain in right shoulder: Secondary | ICD-10-CM | POA: Diagnosis not present

## 2015-09-17 DIAGNOSIS — M4802 Spinal stenosis, cervical region: Secondary | ICD-10-CM | POA: Diagnosis not present

## 2015-09-17 DIAGNOSIS — M542 Cervicalgia: Secondary | ICD-10-CM | POA: Diagnosis not present

## 2015-09-17 DIAGNOSIS — M5030 Other cervical disc degeneration, unspecified cervical region: Secondary | ICD-10-CM | POA: Diagnosis not present

## 2015-09-26 DIAGNOSIS — M4802 Spinal stenosis, cervical region: Secondary | ICD-10-CM | POA: Diagnosis not present

## 2015-09-26 DIAGNOSIS — M542 Cervicalgia: Secondary | ICD-10-CM | POA: Diagnosis not present

## 2015-09-26 DIAGNOSIS — M5030 Other cervical disc degeneration, unspecified cervical region: Secondary | ICD-10-CM | POA: Diagnosis not present

## 2015-10-10 DIAGNOSIS — M7541 Impingement syndrome of right shoulder: Secondary | ICD-10-CM | POA: Diagnosis not present

## 2015-10-10 DIAGNOSIS — M4802 Spinal stenosis, cervical region: Secondary | ICD-10-CM | POA: Diagnosis not present

## 2015-10-10 DIAGNOSIS — M25511 Pain in right shoulder: Secondary | ICD-10-CM | POA: Diagnosis not present

## 2015-11-11 ENCOUNTER — Other Ambulatory Visit: Payer: Self-pay | Admitting: Neurological Surgery

## 2015-11-11 DIAGNOSIS — M4316 Spondylolisthesis, lumbar region: Secondary | ICD-10-CM | POA: Diagnosis not present

## 2015-12-18 ENCOUNTER — Other Ambulatory Visit (HOSPITAL_COMMUNITY): Payer: BC Managed Care – PPO

## 2016-01-02 ENCOUNTER — Encounter (HOSPITAL_COMMUNITY): Payer: Self-pay

## 2016-01-03 ENCOUNTER — Ambulatory Visit (HOSPITAL_COMMUNITY)
Admission: RE | Admit: 2016-01-03 | Discharge: 2016-01-03 | Disposition: A | Discharge status: Home / Self Care | Payer: BC Managed Care – PPO | Source: Ambulatory Visit | Attending: Neurological Surgery | Admitting: Neurological Surgery

## 2016-01-03 ENCOUNTER — Encounter (HOSPITAL_COMMUNITY): Payer: Self-pay

## 2016-01-03 ENCOUNTER — Encounter (HOSPITAL_COMMUNITY)
Admission: RE | Admit: 2016-01-03 | Discharge: 2016-01-03 | Disposition: A | Discharge status: Home / Self Care | Payer: BC Managed Care – PPO | Source: Ambulatory Visit | Attending: Neurological Surgery | Admitting: Neurological Surgery

## 2016-01-03 DIAGNOSIS — Z01812 Encounter for preprocedural laboratory examination: Secondary | ICD-10-CM | POA: Insufficient documentation

## 2016-01-03 DIAGNOSIS — M4316 Spondylolisthesis, lumbar region: Secondary | ICD-10-CM | POA: Insufficient documentation

## 2016-01-03 DIAGNOSIS — Z01810 Encounter for preprocedural cardiovascular examination: Secondary | ICD-10-CM | POA: Insufficient documentation

## 2016-01-03 DIAGNOSIS — M4310 Spondylolisthesis, site unspecified: Secondary | ICD-10-CM

## 2016-01-03 DIAGNOSIS — Z01818 Encounter for other preprocedural examination: Secondary | ICD-10-CM | POA: Diagnosis not present

## 2016-01-03 HISTORY — DX: Unspecified osteoarthritis, unspecified site: M19.90

## 2016-01-03 LAB — CBC WITH DIFFERENTIAL/PLATELET
BASOS PCT: 0 %
Basophils Absolute: 0 10*3/uL (ref 0.0–0.1)
Eosinophils Absolute: 0.3 10*3/uL (ref 0.0–0.7)
Eosinophils Relative: 5 %
HEMATOCRIT: 42.5 % (ref 39.0–52.0)
Hemoglobin: 14.5 g/dL (ref 13.0–17.0)
LYMPHS ABS: 1.6 10*3/uL (ref 0.7–4.0)
Lymphocytes Relative: 23 %
MCH: 33.7 pg (ref 26.0–34.0)
MCHC: 34.1 g/dL (ref 30.0–36.0)
MCV: 98.8 fL (ref 78.0–100.0)
MONO ABS: 0.4 10*3/uL (ref 0.1–1.0)
MONOS PCT: 6 %
NEUTROS ABS: 4.4 10*3/uL (ref 1.7–7.7)
Neutrophils Relative %: 66 %
Platelets: 175 10*3/uL (ref 150–400)
RBC: 4.3 MIL/uL (ref 4.22–5.81)
RDW: 12.6 % (ref 11.5–15.5)
WBC: 6.7 10*3/uL (ref 4.0–10.5)

## 2016-01-03 LAB — BASIC METABOLIC PANEL
ANION GAP: 11 (ref 5–15)
BUN: 20 mg/dL (ref 6–20)
CALCIUM: 9.3 mg/dL (ref 8.9–10.3)
CHLORIDE: 109 mmol/L (ref 101–111)
CO2: 18 mmol/L — AB (ref 22–32)
CREATININE: 0.81 mg/dL (ref 0.61–1.24)
GFR calc Af Amer: >60 mL/min (ref >60)
GFR calc non Af Amer: >60 mL/min (ref >60)
GLUCOSE: 87 mg/dL (ref 65–99)
Potassium: 4.1 mmol/L (ref 3.5–5.1)
Sodium: 138 mmol/L (ref 135–145)

## 2016-01-03 LAB — PROTIME-INR
INR: 1.08
Prothrombin Time: 14.1 seconds (ref 11.4–15.2)

## 2016-01-03 LAB — SURGICAL PCR SCREEN
MRSA, PCR: NEGATIVE
Staphylococcus aureus: NEGATIVE

## 2016-01-03 MED ORDER — CHLORHEXIDINE GLUCONATE CLOTH 2 % EX PADS: 6.0000 | MEDICATED_PAD | Freq: Once | CUTANEOUS | Status: DC

## 2016-01-03 NOTE — Pre-Procedure Instructions (Signed)
Brian Hardy  01/03/2016      [id number] - [place], [place] - [address] [place] [address] Phone: [phone] Fax: [phone]    Your procedure is scheduled on Friday December 15.  Report to [facility] Admitting at 8:15 A.M.  Call this number if you have problems the morning of surgery:  [phone]   Remember:  Do not eat food or drink liquids after midnight.  Take these medicines the morning of surgery with A SIP OF WATER: tylenol if needed  7 days prior to surgery STOP taking any celecoxib (Celebrex) Aspirin, Aleve, Naproxen, Ibuprofen, Motrin, Advil, Goody's, BC's, all herbal medications, fish oil, and all vitamins    Do not wear jewelry, make-up or nail polish.  Do not wear lotions, powders, or perfumes, or deoderant.  Do not shave 48 hours prior to surgery.  Men may shave face and neck.  Do not bring valuables to the hospital.  [facility] is not responsible for any belongings or valuables.  Contacts, dentures or bridgework may not be worn into surgery.  Leave your suitcase in the car.  After surgery it may be brought to your room.  For patients admitted to the hospital, discharge time will be determined by your treatment team.  Patients discharged the day of surgery will not be allowed to drive home.    Special instructions:    [place]- Preparing For Surgery  Before surgery, you can play an important role. Because skin is not sterile, your skin needs to be as free of germs as possible. You can reduce the number of germs on your skin by washing with CHG (chlorahexidine gluconate) Soap before surgery.  CHG is an antiseptic cleaner which kills germs and bonds with the skin to continue killing germs even after washing.  Please do not use if you have an allergy to CHG or antibacterial soaps. If your skin becomes reddened/irritated stop using the CHG.  Do not shave (including  legs and underarms) for at least 48 hours prior to first CHG shower. It is OK to shave your face.  Please follow these instructions carefully.   1. Shower the NIGHT BEFORE SURGERY and the MORNING OF SURGERY with CHG.   2. If you chose to wash your hair, wash your hair first as usual with your normal shampoo.  3. After you shampoo, rinse your hair and body thoroughly to remove the shampoo.  4. Use CHG as you would any other liquid soap. You can apply CHG directly to the skin and wash gently with a scrungie or a clean washcloth.   5. Apply the CHG Soap to your body ONLY FROM THE NECK DOWN.  Do not use on open wounds or open sores. Avoid contact with your eyes, ears, mouth and genitals (private parts). Wash genitals (private parts) with your normal soap.  6. Wash thoroughly, paying special attention to the area where your surgery will be performed.  7. Thoroughly rinse your body with warm water from the neck down.  8. DO NOT shower/wash with your normal soap after using and rinsing off the CHG Soap.  9. Pat yourself dry with a CLEAN TOWEL.   10. Wear CLEAN PAJAMAS   11. Place CLEAN SHEETS on your bed the night of your first shower and DO NOT SLEEP WITH PETS.    Day of Surgery: Do not apply any deodorants/lotions. Please wear clean  clothes to the hospital/surgery center.      Please read over the following fact sheets that you were given. MRSA Information

## 2016-01-03 NOTE — Progress Notes (Signed)
PCP: Earle Gell Pt denies cardiologist or cardiac workup  CXR: 01/03/16 EKG: 01/03/16  Pt with no complaints of chest pain, SOB or signs of infection at PAT appointment.

## 2016-01-06 NOTE — Progress Notes (Signed)
Anesthesia Chart Review:  Pt is a 80 year old male scheduled for L4-5 maximum access PLIF on 01/10/2016 with Sherley Bounds, MD.   - PCP is listed as Earle Gell, MD.   PMH includes:  Arthritis. Former smoker. BMI 24  Medications reviewed.   CXR 01/03/16: No active cardiopulmonary disease.  EKG 01/03/16: NSR. LAFB. Nonspecific ST abnormality. I routed a copy to Dr. Wynetta Emery for follow up purposes.   If no changes, I anticipate pt can proceed with surgery as scheduled.   Willeen Cass, FNP-BC [facility] Short Stay Surgical Center/Anesthesiology Phone: [phone] 01/06/2016 1:45 PM

## 2016-01-10 ENCOUNTER — Inpatient Hospital Stay (HOSPITAL_COMMUNITY): Payer: BC Managed Care – PPO

## 2016-01-10 ENCOUNTER — Inpatient Hospital Stay (HOSPITAL_COMMUNITY): Payer: BC Managed Care – PPO | Admitting: Emergency Medicine

## 2016-01-10 ENCOUNTER — Encounter (HOSPITAL_COMMUNITY)
Admission: RE | Disposition: A | Discharge status: Home / Self Care | Payer: Self-pay | Source: Ambulatory Visit | Attending: Neurological Surgery

## 2016-01-10 ENCOUNTER — Inpatient Hospital Stay (HOSPITAL_COMMUNITY)
Admission: RE | Admit: 2016-01-10 | Discharge: 2016-01-11 | DRG: 460 | Disposition: A | Discharge status: Home / Self Care | Payer: BC Managed Care – PPO | Source: Ambulatory Visit | Attending: Neurological Surgery | Admitting: Neurological Surgery

## 2016-01-10 ENCOUNTER — Encounter (HOSPITAL_COMMUNITY): Payer: Self-pay | Admitting: Neurological Surgery

## 2016-01-10 DIAGNOSIS — Z981 Arthrodesis status: Secondary | ICD-10-CM

## 2016-01-10 DIAGNOSIS — Z882 Allergy status to sulfonamides status: Secondary | ICD-10-CM

## 2016-01-10 DIAGNOSIS — M4316 Spondylolisthesis, lumbar region: Secondary | ICD-10-CM | POA: Diagnosis present

## 2016-01-10 DIAGNOSIS — Z87891 Personal history of nicotine dependence: Secondary | ICD-10-CM

## 2016-01-10 DIAGNOSIS — Z419 Encounter for procedure for purposes other than remedying health state, unspecified: Secondary | ICD-10-CM

## 2016-01-10 DIAGNOSIS — M4326 Fusion of spine, lumbar region: Secondary | ICD-10-CM | POA: Diagnosis not present

## 2016-01-10 DIAGNOSIS — M48061 Spinal stenosis, lumbar region without neurogenic claudication: Secondary | ICD-10-CM | POA: Diagnosis present

## 2016-01-10 HISTORY — PX: MAXIMUM ACCESS (MAS)POSTERIOR LUMBAR INTERBODY FUSION (PLIF) 1 LEVEL: SHX6368

## 2016-01-10 LAB — TYPE AND SCREEN
ABO/RH(D): A POS
Antibody Screen: NEGATIVE

## 2016-01-10 SURGERY — FOR MAXIMUM ACCESS (MAS) POSTERIOR LUMBAR INTERBODY FUSION (PLIF) 1 LEVEL
Anesthesia: General | Site: Back

## 2016-01-10 MED ORDER — PROPOFOL 10 MG/ML IV BOLUS
INTRAVENOUS | Status: DC | PRN
  Administered 2016-01-10: 100 mg via INTRAVENOUS

## 2016-01-10 MED ORDER — POTASSIUM CHLORIDE IN NACL 20-0.9 MEQ/L-% IV SOLN: INTRAVENOUS | Status: DC

## 2016-01-10 MED ORDER — METHOCARBAMOL 500 MG PO TABS
500.0000 mg | ORAL_TABLET | Freq: Four times a day (QID) | ORAL | Status: DC | PRN
  Administered 2016-01-10 – 2016-01-11 (×2): 500 mg via ORAL
  Filled 2016-01-10 (×2): qty 1

## 2016-01-10 MED ORDER — EPHEDRINE SULFATE 50 MG/ML IJ SOLN
INTRAMUSCULAR | Status: DC | PRN
Start: 2016-01-10 — End: 2016-01-10
  Administered 2016-01-10: 5 mg via INTRAVENOUS
  Administered 2016-01-10: 10 mg via INTRAVENOUS
  Administered 2016-01-10: 5 mg via INTRAVENOUS

## 2016-01-10 MED ORDER — DEXAMETHASONE SODIUM PHOSPHATE 4 MG/ML IJ SOLN
INTRAMUSCULAR | Status: DC | PRN
Start: 2016-01-10 — End: 2016-01-10
  Administered 2016-01-10: 10 mg via INTRAVENOUS

## 2016-01-10 MED ORDER — MORPHINE SULFATE (PF) 4 MG/ML IV SOLN
1.0000 mg | INTRAVENOUS | Status: DC | PRN
  Administered 2016-01-10: 4 mg via INTRAVENOUS
  Filled 2016-01-10: qty 1

## 2016-01-10 MED ORDER — MIDAZOLAM HCL 2 MG/2ML IJ SOLN
INTRAMUSCULAR | Status: AC
  Filled 2016-01-10: qty 2

## 2016-01-10 MED ORDER — ACETAMINOPHEN 650 MG RE SUPP: 650.0000 mg | RECTAL | Status: DC | PRN

## 2016-01-10 MED ORDER — CEFAZOLIN SODIUM-DEXTROSE 2-4 GM/100ML-% IV SOLN
2.0000 g | INTRAVENOUS | Status: AC
  Administered 2016-01-10: 2 g via INTRAVENOUS
  Filled 2016-01-10: qty 100

## 2016-01-10 MED ORDER — FENTANYL CITRATE (PF) 100 MCG/2ML IJ SOLN
INTRAMUSCULAR | Status: DC | PRN
  Administered 2016-01-10 (×3): 50 ug via INTRAVENOUS
  Administered 2016-01-10: 100 ug via INTRAVENOUS
  Administered 2016-01-10: 50 ug via INTRAVENOUS

## 2016-01-10 MED ORDER — ONDANSETRON HCL 4 MG/2ML IJ SOLN
INTRAMUSCULAR | Status: DC | PRN
  Administered 2016-01-10: 4 mg via INTRAVENOUS

## 2016-01-10 MED ORDER — SODIUM CHLORIDE 0.9% FLUSH
3.0000 mL | Freq: Two times a day (BID) | INTRAVENOUS | Status: DC
Start: 2016-01-10 — End: 2016-01-11
  Administered 2016-01-10 – 2016-01-11 (×2): 3 mL via INTRAVENOUS

## 2016-01-10 MED ORDER — OXYCODONE-ACETAMINOPHEN 5-325 MG PO TABS
1.0000 | ORAL_TABLET | ORAL | Status: DC | PRN
  Administered 2016-01-10 – 2016-01-11 (×4): 2 via ORAL
  Filled 2016-01-10 (×4): qty 2

## 2016-01-10 MED ORDER — LIDOCAINE 2% (20 MG/ML) 5 ML SYRINGE
INTRAMUSCULAR | Status: AC
  Filled 2016-01-10: qty 5

## 2016-01-10 MED ORDER — DEXAMETHASONE SODIUM PHOSPHATE 10 MG/ML IJ SOLN
10.0000 mg | INTRAMUSCULAR | Status: DC
  Filled 2016-01-10: qty 1

## 2016-01-10 MED ORDER — FENTANYL CITRATE (PF) 100 MCG/2ML IJ SOLN
INTRAMUSCULAR | Status: AC
  Filled 2016-01-10: qty 4

## 2016-01-10 MED ORDER — SODIUM CHLORIDE 0.9% FLUSH: 3.0000 mL | INTRAVENOUS | Status: DC | PRN

## 2016-01-10 MED ORDER — PHENYLEPHRINE 40 MCG/ML (10ML) SYRINGE FOR IV PUSH (FOR BLOOD PRESSURE SUPPORT)
PREFILLED_SYRINGE | INTRAVENOUS | Status: AC
  Filled 2016-01-10: qty 10

## 2016-01-10 MED ORDER — SODIUM CHLORIDE 0.9 % IV SOLN: 250.0000 mL | INTRAVENOUS | Status: DC

## 2016-01-10 MED ORDER — LACTATED RINGERS IV SOLN
INTRAVENOUS | Status: DC
  Administered 2016-01-10: 11:00:00 via INTRAVENOUS
  Administered 2016-01-10: 50 mL/h via INTRAVENOUS

## 2016-01-10 MED ORDER — ROCURONIUM BROMIDE 50 MG/5ML IV SOSY
PREFILLED_SYRINGE | INTRAVENOUS | Status: AC
  Filled 2016-01-10: qty 5

## 2016-01-10 MED ORDER — PROPOFOL 10 MG/ML IV BOLUS
INTRAVENOUS | Status: AC
  Filled 2016-01-10: qty 20

## 2016-01-10 MED ORDER — LIDOCAINE HCL (CARDIAC) 20 MG/ML IV SOLN
INTRAVENOUS | Status: DC | PRN
  Administered 2016-01-10: 100 mg via INTRAVENOUS

## 2016-01-10 MED ORDER — MIDAZOLAM HCL 5 MG/5ML IJ SOLN
INTRAMUSCULAR | Status: DC | PRN
  Administered 2016-01-10: 2 mg via INTRAVENOUS

## 2016-01-10 MED ORDER — MENTHOL 3 MG MT LOZG: 1.0000 | LOZENGE | OROMUCOSAL | Status: DC | PRN

## 2016-01-10 MED ORDER — HYDROMORPHONE HCL 1 MG/ML IJ SOLN
INTRAMUSCULAR | Status: AC
  Filled 2016-01-10: qty 0.5

## 2016-01-10 MED ORDER — PHENOL 1.4 % MT LIQD: 1.0000 | OROMUCOSAL | Status: DC | PRN

## 2016-01-10 MED ORDER — ONDANSETRON HCL 4 MG/2ML IJ SOLN: 4.0000 mg | INTRAMUSCULAR | Status: DC | PRN

## 2016-01-10 MED ORDER — PHENYLEPHRINE HCL 10 MG/ML IJ SOLN
INTRAMUSCULAR | Status: DC | PRN
  Administered 2016-01-10: 80 ug via INTRAVENOUS
  Administered 2016-01-10: 40 ug via INTRAVENOUS
  Administered 2016-01-10: 80 ug via INTRAVENOUS

## 2016-01-10 MED ORDER — THROMBIN 5000 UNITS EX SOLR
OROMUCOSAL | Status: DC | PRN
  Administered 2016-01-10: 11:00:00 via TOPICAL

## 2016-01-10 MED ORDER — SODIUM CHLORIDE 0.9 % IR SOLN
Status: DC | PRN
  Administered 2016-01-10: 11:00:00

## 2016-01-10 MED ORDER — HYDROMORPHONE HCL 1 MG/ML IJ SOLN
0.2500 mg | INTRAMUSCULAR | Status: DC | PRN
  Administered 2016-01-10 (×2): 0.5 mg via INTRAVENOUS

## 2016-01-10 MED ORDER — 0.9 % SODIUM CHLORIDE (POUR BTL) OPTIME
TOPICAL | Status: DC | PRN
  Administered 2016-01-10: 1000 mL

## 2016-01-10 MED ORDER — METHOCARBAMOL 1000 MG/10ML IJ SOLN
500.0000 mg | Freq: Four times a day (QID) | INTRAVENOUS | Status: DC | PRN
  Filled 2016-01-10: qty 5

## 2016-01-10 MED ORDER — FENTANYL CITRATE (PF) 100 MCG/2ML IJ SOLN
INTRAMUSCULAR | Status: AC
  Filled 2016-01-10: qty 2

## 2016-01-10 MED ORDER — ACETAMINOPHEN 325 MG PO TABS: 650.0000 mg | ORAL_TABLET | ORAL | Status: DC | PRN

## 2016-01-10 MED ORDER — VANCOMYCIN HCL 1000 MG IV SOLR
INTRAVENOUS | Status: DC | PRN
  Administered 2016-01-10: 1000 mg

## 2016-01-10 MED ORDER — THROMBIN 20000 UNITS EX SOLR
CUTANEOUS | Status: AC
  Filled 2016-01-10: qty 20000

## 2016-01-10 MED ORDER — SURGIFOAM 100 EX MISC
CUTANEOUS | Status: DC | PRN
  Administered 2016-01-10: 11:00:00 via TOPICAL

## 2016-01-10 MED ORDER — EPHEDRINE 5 MG/ML INJ
INTRAVENOUS | Status: AC
  Filled 2016-01-10: qty 10

## 2016-01-10 MED ORDER — SUCCINYLCHOLINE CHLORIDE 20 MG/ML IJ SOLN
INTRAMUSCULAR | Status: DC | PRN
  Administered 2016-01-10: 120 mg via INTRAVENOUS

## 2016-01-10 MED ORDER — BUPIVACAINE HCL (PF) 0.25 % IJ SOLN
INTRAMUSCULAR | Status: DC | PRN
  Administered 2016-01-10: 5 mL

## 2016-01-10 MED ORDER — ONDANSETRON HCL 4 MG/2ML IJ SOLN
INTRAMUSCULAR | Status: AC
  Filled 2016-01-10: qty 2

## 2016-01-10 MED ORDER — VANCOMYCIN HCL 1000 MG IV SOLR
INTRAVENOUS | Status: AC
  Filled 2016-01-10: qty 1000

## 2016-01-10 MED ORDER — PROPOFOL 500 MG/50ML IV EMUL
INTRAVENOUS | Status: DC | PRN
  Administered 2016-01-10: 50 ug/kg/min via INTRAVENOUS

## 2016-01-10 MED ORDER — BUPIVACAINE HCL (PF) 0.25 % IJ SOLN
INTRAMUSCULAR | Status: AC
  Filled 2016-01-10: qty 30

## 2016-01-10 MED ORDER — THROMBIN 5000 UNITS EX SOLR
CUTANEOUS | Status: AC
  Filled 2016-01-10: qty 5000

## 2016-01-10 MED ORDER — CEFAZOLIN IN D5W 1 GM/50ML IV SOLN
1.0000 g | Freq: Three times a day (TID) | INTRAVENOUS | Status: AC
  Administered 2016-01-10 – 2016-01-11 (×2): 1 g via INTRAVENOUS
  Filled 2016-01-10 (×2): qty 50

## 2016-01-10 MED ORDER — SUCCINYLCHOLINE CHLORIDE 200 MG/10ML IV SOSY
PREFILLED_SYRINGE | INTRAVENOUS | Status: AC
  Filled 2016-01-10: qty 10

## 2016-01-10 SURGICAL SUPPLY — 69 items
ADH SKN CLS APL DERMABOND .7 (GAUZE/BANDAGES/DRESSINGS) ×1
APL SKNCLS STERI-STRIP NONHPOA (GAUZE/BANDAGES/DRESSINGS) ×1
BAG DECANTER FOR FLEXI CONT (MISCELLANEOUS) ×3 IMPLANT
BENZOIN TINCTURE PRP APPL 2/3 (GAUZE/BANDAGES/DRESSINGS) ×3 IMPLANT
BIT DRILL PLIF MAS DISP 5.5MM (DRILL) IMPLANT
BLADE CLIPPER SURG (BLADE) IMPLANT
BUR MATCHSTICK NEURO 3.0 LAGG (BURR) ×3 IMPLANT
CAGE COROENT LRG MP 11X9X28-8 (Cage) ×4 IMPLANT
CANISTER SUCT 3000ML PPV (MISCELLANEOUS) ×3 IMPLANT
CAP RELINE MOD TULIP RMM (Cap) ×8 IMPLANT
CARTRIDGE OIL MAESTRO DRILL (MISCELLANEOUS) ×1 IMPLANT
CATH FOLEY 2WAY SLVR  5CC 14FR (CATHETERS) ×2
CATH FOLEY 2WAY SLVR 5CC 14FR (CATHETERS) IMPLANT
CLIP NEUROVISION LG (CLIP) ×2 IMPLANT
CLOSURE WOUND 1/2 X4 (GAUZE/BANDAGES/DRESSINGS) ×1
CONT SPEC 4OZ CLIKSEAL STRL BL (MISCELLANEOUS) ×3 IMPLANT
COVER BACK TABLE 24X17X13 BIG (DRAPES) IMPLANT
COVER BACK TABLE 60X90IN (DRAPES) ×3 IMPLANT
DERMABOND ADVANCED (GAUZE/BANDAGES/DRESSINGS) ×2
DERMABOND ADVANCED .7 DNX12 (GAUZE/BANDAGES/DRESSINGS) IMPLANT
DIFFUSER DRILL AIR PNEUMATIC (MISCELLANEOUS) ×3 IMPLANT
DRAPE C-ARM 42X72 X-RAY (DRAPES) ×3 IMPLANT
DRAPE C-ARMOR (DRAPES) ×3 IMPLANT
DRAPE LAPAROTOMY 100X72X124 (DRAPES) ×3 IMPLANT
DRAPE POUCH INSTRU U-SHP 10X18 (DRAPES) ×3 IMPLANT
DRAPE SURG 17X23 STRL (DRAPES) ×3 IMPLANT
DRILL PLIF MAS DISP 5.5MM (DRILL) ×3
DRSG OPSITE POSTOP 4X6 (GAUZE/BANDAGES/DRESSINGS) ×2 IMPLANT
DURAPREP 26ML APPLICATOR (WOUND CARE) ×3 IMPLANT
ELECT REM PT RETURN 9FT ADLT (ELECTROSURGICAL) ×3
ELECTRODE REM PT RTRN 9FT ADLT (ELECTROSURGICAL) ×1 IMPLANT
EVACUATOR 1/8 PVC DRAIN (DRAIN) ×3 IMPLANT
GAUZE SPONGE 4X4 16PLY XRAY LF (GAUZE/BANDAGES/DRESSINGS) IMPLANT
GLOVE BIO SURGEON STRL SZ8 (GLOVE) ×6 IMPLANT
GLOVE ECLIPSE 7.5 STRL STRAW (GLOVE) ×6 IMPLANT
GLOVE ECLIPSE 9.0 STRL (GLOVE) ×2 IMPLANT
GLOVE INDICATOR 7.5 STRL GRN (GLOVE) ×4 IMPLANT
GLOVE INDICATOR 8.0 STRL GRN (GLOVE) ×2 IMPLANT
GOWN STRL REUS W/ TWL LRG LVL3 (GOWN DISPOSABLE) IMPLANT
GOWN STRL REUS W/ TWL XL LVL3 (GOWN DISPOSABLE) ×2 IMPLANT
GOWN STRL REUS W/TWL 2XL LVL3 (GOWN DISPOSABLE) ×2 IMPLANT
GOWN STRL REUS W/TWL LRG LVL3 (GOWN DISPOSABLE)
GOWN STRL REUS W/TWL XL LVL3 (GOWN DISPOSABLE) ×6
HEMOSTAT POWDER KIT SURGIFOAM (HEMOSTASIS) ×2 IMPLANT
KIT BASIN OR (CUSTOM PROCEDURE TRAY) ×3 IMPLANT
KIT ROOM TURNOVER OR (KITS) ×3 IMPLANT
MILL MEDIUM DISP (BLADE) ×2 IMPLANT
MODULE NVM5 NEXT GEN EMG (NEEDLE) ×2 IMPLANT
NDL HYPO 25X1 1.5 SAFETY (NEEDLE) ×1 IMPLANT
NEEDLE HYPO 25X1 1.5 SAFETY (NEEDLE) ×3 IMPLANT
NS IRRIG 1000ML POUR BTL (IV SOLUTION) ×3 IMPLANT
OIL CARTRIDGE MAESTRO DRILL (MISCELLANEOUS) ×3
PACK LAMINECTOMY NEURO (CUSTOM PROCEDURE TRAY) ×3 IMPLANT
PAD ARMBOARD 7.5X6 YLW CONV (MISCELLANEOUS) ×9 IMPLANT
PUTTY BONE ATTRAX 5CC STRIP (Putty) ×2 IMPLANT
ROD RELINE COCR LORD 5X40MM (Rod) ×4 IMPLANT
SCREW LOCK RSS 4.5/5.0MM (Screw) ×8 IMPLANT
SHANK RELINE MOD 5.5X40 (Screw) ×8 IMPLANT
SPONGE LAP 4X18 X RAY DECT (DISPOSABLE) IMPLANT
SPONGE SURGIFOAM ABS GEL 100 (HEMOSTASIS) ×3 IMPLANT
STRIP CLOSURE SKIN 1/2X4 (GAUZE/BANDAGES/DRESSINGS) ×3 IMPLANT
SUT VIC AB 0 CT1 18XCR BRD8 (SUTURE) ×1 IMPLANT
SUT VIC AB 0 CT1 8-18 (SUTURE) ×3
SUT VIC AB 2-0 CP2 18 (SUTURE) ×3 IMPLANT
SUT VIC AB 3-0 SH 8-18 (SUTURE) ×4 IMPLANT
TOWEL OR 17X24 6PK STRL BLUE (TOWEL DISPOSABLE) ×3 IMPLANT
TOWEL OR 17X26 10 PK STRL BLUE (TOWEL DISPOSABLE) ×3 IMPLANT
TRAY FOLEY W/METER SILVER 16FR (SET/KITS/TRAYS/PACK) ×3 IMPLANT
WATER STERILE IRR 1000ML POUR (IV SOLUTION) ×3 IMPLANT

## 2016-01-10 NOTE — Anesthesia Procedure Notes (Signed)
Procedure Name: Intubation Date/Time: 01/10/2016 11:06 AM Performed by: Ollen Bowl Pre-anesthesia Checklist: Patient identified, Emergency Drugs available, Suction available, Patient being monitored and Timeout performed Patient Re-evaluated:Patient Re-evaluated prior to inductionOxygen Delivery Method: Circle system utilized and Simple face mask Preoxygenation: Pre-oxygenation with 100% oxygen Intubation Type: IV induction Ventilation: Mask ventilation without difficulty Laryngoscope Size: Miller and 3 Grade View: Grade I Tube type: Oral Tube size: 7.5 mm Number of attempts: 1 Airway Equipment and Method: Patient positioned with wedge pillow and Stylet Placement Confirmation: ETT inserted through vocal cords under direct vision,  positive ETCO2 and breath sounds checked- equal and bilateral Secured at: 22 cm Tube secured with: Tape Dental Injury: Teeth and Oropharynx as per pre-operative assessment

## 2016-01-10 NOTE — H&P (Signed)
Subjective: Patient is a 80 y.o. male admitted for PLIf. Onset of symptoms was a few months ago, gradually worsening since that time.  The pain is rated severe, and is located at the across the lower back and radiates to legs. The pain is described as aching and occurs all day. The symptoms have been progressive. Symptoms are exacerbated by exercise. MRI or CT showed spondy with stenosis   Past Medical History:  Diagnosis Date  . Arthritis     Past Surgical History:  Procedure Laterality Date  . BACK SURGERY    . COLONOSCOPY WITH PROPOFOL N/A 10/03/2013   Procedure: COLONOSCOPY WITH PROPOFOL;  Surgeon: Garlan Fair, MD;  Location: [facility];  Service: Endoscopy;  Laterality: N/A;  . cyst removal     behind knee  . HERNIA REPAIR  2006  . INGUINAL HERNIA REPAIR  2013  . SPINE SURGERY  2009-2010    Prior to Admission medications   Medication Sig Start Date End Date Taking? Authorizing Provider  acetaminophen (TYLENOL) 325 MG tablet Take 650 mg by mouth every 6 (six) hours as needed for mild pain or headache.   Yes Historical Provider, MD  celecoxib (CELEBREX) 200 MG capsule Take 200 mg by mouth 2 (two) times daily. 12/03/15  Yes Historical Provider, MD   Allergies  Allergen Reactions  . Sulfa Antibiotics Hives and Swelling    Social History  Substance Use Topics  . Smoking status: Former Smoker    Types: Cigarettes    Quit date: 12/21/1984  . Smokeless tobacco: Never Used  . Alcohol use 3.6 oz/week    6 Cans of beer per week     Comment: social, occasionally     Family History  Problem Relation Age of Onset  . Dementia Mother   . Pneumonia Father      Review of Systems  Positive ROS: neg  All other systems have been reviewed and were otherwise negative with the exception of those mentioned in the HPI and as above.  Objective: Vital signs in last 24 hours:    General Appearance: Alert, cooperative, no distress, appears stated age Head: Normocephalic, without  obvious abnormality, atraumatic Eyes: PERRL, conjunctiva/corneas clear, EOM's intact    Neck: Supple, symmetrical, trachea midline Back: Symmetric, no curvature, ROM normal, no CVA tenderness Lungs:  respirations unlabored Heart: Regular rate and rhythm Abdomen: Soft, non-tender Extremities: Extremities normal, atraumatic, no cyanosis or edema Pulses: 2+ and symmetric all extremities Skin: Skin color, texture, turgor normal, no rashes or lesions  NEUROLOGIC:   Mental status: Alert and oriented x4,  no aphasia, good attention span, fund of knowledge, and memory Motor Exam - grossly normal Sensory Exam - grossly normal Reflexes: 1+ Coordination - grossly normal Gait - grossly normal Balance - grossly normal Cranial Nerves: I: smell Not tested  II: visual acuity  OS: nl    OD: nl  II: visual fields Full to confrontation  II: pupils Equal, round, reactive to light  III,VII: ptosis None  III,IV,VI: extraocular muscles  Full ROM  V: mastication Normal  V: facial light touch sensation  Normal  V,VII: corneal reflex  Present  VII: facial muscle function - upper  Normal  VII: facial muscle function - lower Normal  VIII: hearing Not tested  IX: soft palate elevation  Normal  IX,X: gag reflex Present  XI: trapezius strength  5/5  XI: sternocleidomastoid strength 5/5  XI: neck flexion strength  5/5  XII: tongue strength  Normal    Data  Review Lab Results  Component Value Date   WBC 6.7 01/03/2016   HGB 14.5 01/03/2016   HCT 42.5 01/03/2016   MCV 98.8 01/03/2016   PLT 175 01/03/2016   Lab Results  Component Value Date   NA 138 01/03/2016   K 4.1 01/03/2016   CL 109 01/03/2016   CO2 18 (L) 01/03/2016   BUN 20 01/03/2016   CREATININE 0.81 01/03/2016   GLUCOSE 87 01/03/2016   Lab Results  Component Value Date   INR 1.08 01/03/2016    Assessment/Plan: Patient admitted for PLIF L4-5. Patient has failed a reasonable attempt at conservative therapy.  I explained the  condition and procedure to the patient and answered any questions.  Patient wishes to proceed with procedure as planned. Understands risks/ benefits and typical outcomes of procedure.   Onix Jumper S 01/10/2016 7:31 AM

## 2016-01-10 NOTE — Evaluation (Signed)
Physical Therapy Evaluation Patient Details Name: Brian Hardy MRN: [id number] DOB: 01-Nov-1935 Today's Date: 01/10/2016   History of Present Illness  Patient is a 80 yo male s/p PLIF L4-5.  Clinical Impression  Patient seen for mobility evaluation s/p spinal surgery. Patient educated on precautions, mobility expectations, safety, car transfers and positioning. Patient mobilizing well and performed stair negotiation without difficulty. Wife will be home to assist prn. Anticipate patient will be safe for d/c home. No further acute PT needs, will sign off.    Follow Up Recommendations No PT follow up    Equipment Recommendations  None recommended by PT    Recommendations for Other Services       Precautions / Restrictions Precautions Precautions: Back Precaution Booklet Issued: Yes (comment) Precaution Comments: verbally reviewed Required Braces or Orthoses: Spinal Brace Spinal Brace: Lumbar corset Restrictions Weight Bearing Restrictions: No      Mobility  Bed Mobility Overal bed mobility: Modified Independent             General bed mobility comments: increased time to perform  Transfers Overall transfer level: Modified independent Equipment used: None             General transfer comment: increased time to come to standing, no physical assist required  Ambulation/Gait Ambulation/Gait assistance: Independent Ambulation Distance (Feet): 380 Feet Assistive device: None Gait Pattern/deviations: Step-through pattern;Narrow base of support     General Gait Details: initially slow guarded gait, improved with distance  Stairs Stairs: Yes Stairs assistance: Modified independent (Device/Increase time) Stair Management: One rail Left Number of Stairs: 12 General stair comments: No difficulty with stair negotiation  Wheelchair Mobility    Modified Rankin (Stroke Patients Only)       Balance Overall balance assessment: No apparent balance deficits  (not formally assessed)                                           Pertinent Vitals/Pain Pain Assessment: 0-10 Pain Score: 5  Pain Location: low back Pain Descriptors / Indicators: Operative site guarding Pain Intervention(s): Monitored during session;Repositioned    Home Living Family/patient expects to be discharged to:: Private residence Living Arrangements: Spouse/significant other Available Help at Discharge: Family Type of Home: House Home Access: Stairs to enter Entrance Stairs-Rails: Can reach both Entrance Stairs-Number of Steps: 4 Home Layout: Two level;Bed/bath upstairs Home Equipment: Walker - 2 wheels;Cane - single point;Shower seat      Prior Function Level of Independence: Independent               Hand Dominance   Dominant Hand: Right    Extremity/Trunk Assessment   Upper Extremity Assessment Upper Extremity Assessment: Overall WFL for tasks assessed    Lower Extremity Assessment Lower Extremity Assessment: Overall WFL for tasks assessed    Cervical / Trunk Assessment Cervical / Trunk Assessment:  (s/p spinal surgery)  Communication   Communication: No difficulties  Cognition Arousal/Alertness: Awake/alert Behavior During Therapy: WFL for tasks assessed/performed Overall Cognitive Status: Within Functional Limits for tasks assessed                      General Comments      Exercises     Assessment/Plan    PT Assessment Patent does not need any further PT services  PT Problem List  PT Treatment Interventions      PT Goals (Current goals can be found in the Care Plan section)  Acute Rehab PT Goals PT Goal Formulation: All assessment and education complete, DC therapy    Frequency     Barriers to discharge        Co-evaluation               End of Session Equipment Utilized During Treatment: Back brace Activity Tolerance: Patient tolerated treatment well Patient left: in  chair;with call bell/phone within reach Nurse Communication: Mobility status    Functional Assessment Tool Used: clinical judgement Functional Limitation: Mobility: Walking and moving around Mobility: Walking and Moving Around Current Status [id number]): At least 1 percent but less than 20 percent impaired, limited or restricted Mobility: Walking and Moving Around Goal Status [phone]): At least 1 percent but less than 20 percent impaired, limited or restricted Mobility: Walking and Moving Around Discharge Status [phone]): At least 1 percent but less than 20 percent impaired, limited or restricted    Time: [id number] PT Time Calculation (min) (ACUTE ONLY): 17 min   Charges:   PT Evaluation $PT Eval Low Complexity: 1 Procedure     PT G Codes:   PT G-Codes **NOT FOR INPATIENT CLASS** Functional Assessment Tool Used: clinical judgement Functional Limitation: Mobility: Walking and moving around Mobility: Walking and Moving Around Current Status [id number]): At least 1 percent but less than 20 percent impaired, limited or restricted Mobility: Walking and Moving Around Goal Status [phone]): At least 1 percent but less than 20 percent impaired, limited or restricted Mobility: Walking and Moving Around Discharge Status [phone]): At least 1 percent but less than 20 percent impaired, limited or restricted    Duncan Dull 01/10/2016, [place], [place] DPT  [phone]

## 2016-01-10 NOTE — OR Nursing (Signed)
ATTEMPTED FOLEY CATHETER INSERTION WITH ASSISTANCE OF ALEXIA NICHOLS RN NOT ABLE TO INSERT DR Ronnald Ramp NOTIFIED STATED NO UROLOGY CONSULT NEEDED PROCEDURE WILL ONLY BE TWO HOURS OR LESS. DR Ronnald Ramp REQUEST CONDOM CATHETER BE PLACED AT END OF CASE.  NUVASIVE NERVE MONITORING ELECTRODES PLACED UPPER AND LOWER TRUNK.

## 2016-01-10 NOTE — OR Nursing (Signed)
1315 condom catheter placed per Alexia Vernell Barrier and Bryna Colander RN

## 2016-01-10 NOTE — Transfer of Care (Signed)
Immediate Anesthesia Transfer of Care Note  Patient: Brian Hardy  Procedure(s) Performed: Procedure(s) with comments: LUMBAR FOUR-FIVE MAXIMUM ACCESS SURGERY POSTERIOR LUMBAR INTERBODY FUSION (N/A) - LUMBAR FOUR-FIVE MAXIMUM ACCESS SURGERY POSTERIOR LUMBAR INTERBODY FUSION   Patient Location: PACU  Anesthesia Type:General  Level of Consciousness: awake, alert  and patient cooperative  Airway & Oxygen Therapy: Patient Spontanous Breathing and Patient connected to nasal cannula oxygen  Post-op Assessment: Report given to RN, Post -op Vital signs reviewed and stable and Patient moving all extremities X 4  Post vital signs: Reviewed and stable  Last Vitals:  Vitals:   01/10/16 0816  BP: (!) 161/76  Pulse: 64  Resp: 20  Temp: 36.7 C    Last Pain:  Vitals:   01/10/16 0844  TempSrc:   PainSc: 5       Patients Stated Pain Goal: 3 ([id number])  Complications: No apparent anesthesia complications

## 2016-01-10 NOTE — Anesthesia Preprocedure Evaluation (Addendum)
Anesthesia Evaluation  Patient identified by MRN, date of birth, ID band Patient awake    Reviewed: Allergy & Precautions, H&P , Patient's Chart, lab work & pertinent test results, reviewed documented beta blocker date and time   Airway Mallampati: II  TM Distance: >3 FB Neck ROM: full    Dental no notable dental hx. (+) Teeth Intact, Dental Advisory Given   Pulmonary former smoker,    Pulmonary exam normal breath sounds clear to auscultation       Cardiovascular  Rhythm:regular Rate:Normal     Neuro/Psych    GI/Hepatic   Endo/Other    Renal/GU      Musculoskeletal   Abdominal   Peds  Hematology   Anesthesia Other Findings   Reproductive/Obstetrics                            Anesthesia Physical Anesthesia Plan  ASA: II  Anesthesia Plan: General   Post-op Pain Management:    Induction: Intravenous  Airway Management Planned: Oral ETT  Additional Equipment:   Intra-op Plan:   Post-operative Plan: Extubation in OR  Informed Consent: I have reviewed the patients History and Physical, chart, labs and discussed the procedure including the risks, benefits and alternatives for the proposed anesthesia with the patient or authorized representative who has indicated his/her understanding and acceptance.   Dental Advisory Given and Dental advisory given  Plan Discussed with: CRNA and Surgeon  Anesthesia Plan Comments: (  Discussed general anesthesia, including possible nausea, instrumentation of airway, sore throat,pulmonary aspiration, etc. I asked if the were any outstanding questions, or  concerns before we proceeded. )        Anesthesia Quick Evaluation

## 2016-01-10 NOTE — Op Note (Signed)
01/10/2016  1:27 PM  PATIENT:  Brian Hardy  80 y.o. male  PRE-OPERATIVE DIAGNOSIS: spondylo listhesis with stenosis L4-5  POST-OPERATIVE DIAGNOSIS:  Same  PROCEDURE:   1. Decompressive lumbar laminectomy L4-5 requiring more work than would be required for a simple exposure of the disk for PLIF in order to adequately decompress the neural elements and address the spinal stenosis 2. Posterior lumbar interbody fusion L4-5 using PEEK interbody cages packed with morcellized allograft and autograft 3. Posterior fixation L4-5 using cortical pedicle screws.    SURGEON:  Sherley Bounds, MD  ASSISTANTS: Dr. Annette Stable  ANESTHESIA:  General  EBL: Less than 100 ml  Total I/O In: 800 [I.V.:800] Out: 100 [Blood:100]  BLOOD ADMINISTERED:none  DRAINS:none  INDICATION FOR PROCEDURE: This patient presented with progressive back and leg pain. MRI and plain film showed a spondylolisthesis at L4-5 with spinal stenosis. He tried medical management without relief. I recommended decompression and fusion to address his segmental instability and spinal stenosis. Patient understood the risks, benefits, and alternatives and potential outcomes and wished to proceed.  PROCEDURE DETAILS:  The patient was brought to the operating room. After induction of generalized endotracheal anesthesia the patient was rolled into the prone position on chest rolls and all pressure points were padded. The patient's lumbar region was cleaned and then prepped with DuraPrep and draped in the usual sterile fashion. Anesthesia was injected and then a dorsal midline incision was made and carried down to the lumbosacral fascia. The fascia was opened and the paraspinous musculature was taken down in a subperiosteal fashion to expose L4-5. A self-retaining retractor was placed. Intraoperative fluoroscopy confirmed my level, and I started with placement of the L4 cortical pedicle screws. The pedicle screw entry zones were identified utilizing  surface landmarks and  AP and lateral fluoroscopy. I scored the cortex with the high-speed drill and then used the hand drill and EMG monitoring to drill an upward and outward direction into the pedicle. I then tapped line to line, and the tap was also monitored. I then placed a 5.5 x 40 mm cortical pedicle screw into the pedicles of L4 bilaterally. I then turned my attention to the decompression and complete lumbar laminectomies, hemi- facetectomies, and foraminotomies were performed at L4-5. The patient had significant spinal stenosis and this required more work than would be required for a simple exposure of the disc for posterior lumbar interbody fusion. Much more generous decompression was undertaken in order to adequately decompress the neural elements and address the patient's leg pain. The yellow ligament was removed to expose the underlying dura and nerve roots, and generous foraminotomies were performed to adequately decompress the neural elements. Both the exiting and traversing nerve roots were decompressed on both sides until a coronary dilator passed easily along the nerve roots. Once the decompression was complete, I turned my attention to the posterior lower lumbar interbody fusion. The epidural venous vasculature was coagulated and cut sharply. Disc space was incised and the initial discectomy was performed with pituitary rongeurs. The disc space was distracted with sequential distractors to a height of 11 mm. We then used a series of scrapers and shavers to prepare the endplates for fusion. The midline was prepared with Epstein curettes. Once the complete discectomy was finished, we packed an appropriate sized peek interbody cage with local autograft and morcellized allograft, gently retracted the nerve root, and tapped the cage into position at L4-5.  The midline between the cages was packed with morselized autograft and allograft. We  then turned our attention to the placement of the lower pedicle  screws. The pedicle screw entry zones were identified utilizing surface landmarks and fluoroscopy. I drilled into each pedicle utilizing the hand drill and EMG monitoring, and tapped each pedicle with the appropriate tap. We palpated with a ball probe to assure no break in the cortex. We then placed 5.5 x 40 mm cortical pedicle screws into the pedicles bilaterally at L5.  We then placed lordotic rods into the multiaxial screw heads of the pedicle screws and locked these in position with the locking caps and anti-torque device. We then checked our construct with AP and lateral fluoroscopy. Irrigated with copious amounts of bacitracin-containing saline solution.  Inspected the nerve roots once again to assure adequate decompression, lined to the dura with Gelfoam, and closed the muscle and the fascia with 0 Vicryl. Closed the subcutaneous tissues with 2-0 Vicryl and subcuticular tissues with 3-0 Vicryl. The skin was closed with benzoin and Steri-Strips. Dressing was then applied, the patient was awakened from general anesthesia and transported to the recovery room in stable condition. At the end of the procedure all sponge, needle and instrument counts were correct.   PLAN OF CARE: Admit to inpatient   PATIENT DISPOSITION:  PACU - hemodynamically stable.   Delay start of Pharmacological VTE agent (>24hrs) due to surgical blood loss or risk of bleeding:  yes

## 2016-01-11 MED ORDER — OXYCODONE-ACETAMINOPHEN 5-325 MG PO TABS: 1.0000 | ORAL_TABLET | ORAL | 0 refills | Status: DC | PRN

## 2016-01-11 MED ORDER — METHOCARBAMOL 500 MG PO TABS: 500.0000 mg | ORAL_TABLET | Freq: Four times a day (QID) | ORAL | 1 refills | Status: DC | PRN

## 2016-01-11 NOTE — Evaluation (Signed)
Occupational Therapy Evaluation and Discharge Patient Details Name: Brian Hardy MRN: [id number] DOB: Oct 18, 1935 Today's Date: 01/11/2016    History of Present Illness Patient is a 80 yo male s/p PLIF L4-5.   Clinical Impression   PTA Pt independent in ADL and mobility. Pt currently mod I for ADL and mobility. Pt able to don/doff brace independently and and received all education on precautions, and maintaining during ADL. Pt will have support from wife upon d/c. Pt with no questions or concerns and has no need for further acute OT care. OT to sign off. Thank you for this referral.     Follow Up Recommendations  No OT follow up;Supervision - Intermittent    Equipment Recommendations  None recommended by OT    Recommendations for Other Services       Precautions / Restrictions Precautions Precautions: Back Precaution Booklet Issued: Yes (comment) Precaution Comments: recalled 1/3 verbally reviewed at beginning and end of session with Pt able to recall 3/3 Required Braces or Orthoses: Spinal Brace Spinal Brace: Lumbar corset Restrictions Weight Bearing Restrictions: No      Mobility Bed Mobility               General bed mobility comments: Pt sitting in chair OOB when OT arrived  Transfers Overall transfer level: Modified independent Equipment used: None             General transfer comment: good hand placement and technique    Balance Overall balance assessment: No apparent balance deficits (not formally assessed)                                          ADL Overall ADL's : Modified independent                                       General ADL Comments: Pt able to don underwear by going down on one knee then the other, and perform sink level ADL maintaining precautions. Pt educated on precautions in kitchen, bathroom, closet and functional household activities. Pt acknowledged education, and performed teach back.  Maintained precautions during entire session with no verbal cues. Wife has been through similar surgery 15 years ago.     Vision Vision Assessment?: No apparent visual deficits   Perception     Praxis      Pertinent Vitals/Pain Pain Assessment: 0-10 Pain Score: 3  Pain Location: low back Pain Descriptors / Indicators: Operative site guarding Pain Intervention(s): Monitored during session;Repositioned     Hand Dominance Right   Extremity/Trunk Assessment Upper Extremity Assessment Upper Extremity Assessment: Overall WFL for tasks assessed   Lower Extremity Assessment Lower Extremity Assessment: Overall WFL for tasks assessed   Cervical / Trunk Assessment Cervical / Trunk Assessment: Other exceptions Cervical / Trunk Exceptions: post op back surgery   Communication Communication Communication: No difficulties   Cognition Arousal/Alertness: Awake/alert Behavior During Therapy: WFL for tasks assessed/performed Overall Cognitive Status: Within Functional Limits for tasks assessed                     General Comments       Exercises       Shoulder Instructions      Home Living Family/patient expects to be discharged to:: Private residence Living Arrangements: Spouse/significant other Available  Help at Discharge: Family Type of Home: House Home Access: Stairs to enter [employer] of Steps: 4 Entrance Stairs-Rails: Can reach both Home Layout: Two level;Bed/bath upstairs Alternate Level Stairs-Number of Steps: flight Alternate Level Stairs-Rails: Can reach both Bathroom Shower/Tub: Occupational psychologist: Standard     Home Equipment: Environmental consultant - 2 wheels;Cane - single point;Shower seat          Prior Functioning/Environment Level of Independence: Independent                 OT Problem List: Decreased range of motion;Decreased activity tolerance;Decreased safety awareness;Decreased knowledge of precautions   OT  Treatment/Interventions:      OT Goals(Current goals can be found in the care plan section) Acute Rehab OT Goals Patient Stated Goal: to get home today OT Goal Formulation: With patient Time For Goal Achievement: 01/18/16 Potential to Achieve Goals: Good  OT Frequency:     Barriers to D/C:            Co-evaluation              End of Session Equipment Utilized During Treatment: Back brace Nurse Communication: Mobility status;Other (comment) (Pt clear for DC from OT perspective)  Activity Tolerance: Patient tolerated treatment well Patient left: in chair;with call bell/phone within reach   Time: 0825-0850 OT Time Calculation (min): 25 min Charges:  OT General Charges $OT Visit: 1 Procedure OT Evaluation $OT Eval Moderate Complexity: 1 Procedure OT Treatments $Self Care/Home Management : 8-22 mins G-Codes:    Brian Hardy January 19, 2016, 10:04 AM Brian Hardy OTR/L [phone]

## 2016-01-11 NOTE — Progress Notes (Signed)
Patient given discharge instructions.  All questions and answers addressed.  IV catheter removed without difficulty.  Left unit with all belongings.

## 2016-01-11 NOTE — Discharge Summary (Signed)
Physician Discharge Summary  Patient ID: Brian Hardy MRN: [id number] DOB/AGE: 78/27/1937 78 y.o.  Admit date: 01/10/2016 Discharge date: 01/11/2016  Admission Diagnoses:  Discharge Diagnoses:  Active Problems:   S/P lumbar fusion   Discharged Condition: good  Hospital Course: Patient admitted to the hospital where he underwent an uncompensated lumbar decompression and fusion. Postoperatively he is doing well. Preoperative back and lower extremity pain are improved. Strength and sensation are intact. Patient ambulating without difficulty. Ready for discharge home.  Consults:   Significant Diagnostic Studies:   Treatments:   Discharge Exam: Blood pressure 120/74, pulse 80, temperature 97.4 F (36.3 C), temperature source Oral, resp. rate 16, weight 68.5 kg (151 lb), SpO2 96 %. Awake and alert. Oriented and appropriate. Cranial nerve function intact. Motor and sensory function extremities normal. Wound clean and dry. Chest and abdomen benign.  Disposition: 01-Home or Self Care   Allergies as of 01/11/2016      Reactions   Sulfa Antibiotics Hives, Swelling      Medication List    TAKE these medications   acetaminophen 325 MG tablet Commonly known as:  TYLENOL Take 650 mg by mouth every 6 (six) hours as needed for mild pain or headache.   celecoxib 200 MG capsule Commonly known as:  CELEBREX Take 200 mg by mouth 2 (two) times daily.   methocarbamol 500 MG tablet Commonly known as:  ROBAXIN Take 1 tablet (500 mg total) by mouth every 6 (six) hours as needed for muscle spasms.   oxyCODONE-acetaminophen 5-325 MG tablet Commonly known as:  PERCOCET/ROXICET Take 1-2 tablets by mouth every 4 (four) hours as needed for moderate pain.            Durable Medical Equipment        Start     Ordered   01/10/16 1544  DME Walker rolling  Once    Question:  Patient needs a walker to treat with the following condition  Answer:  S/P lumbar spinal fusion   01/10/16  1543       Signed: Johnta Couts A 01/11/2016, 9:37 AM

## 2016-01-11 NOTE — Discharge Instructions (Signed)
Spinal Fusion, Care After Refer to this sheet in the next few weeks. These instructions provide you with information about caring for yourself after your procedure. Your health care provider may also give you more specific instructions. Your treatment has been planned according to current medical practices, but problems sometimes occur. Call your health care provider if you have any problems or questions after your procedure. WHAT TO EXPECT AFTER THE PROCEDURE After your procedure, it is common to have:  Pain and stiffness in your back.  Pain around your incision. HOME CARE INSTRUCTIONS  Medicines  Take over-the-counter and prescription medicines only as told by your health care provider. These include any pain medicines.  Do not drive for 24 hours if you received a sedative.  Do not drive or operate heavy machinery while taking prescription pain medicine.  If you were prescribed an antibiotic medicine, take it as told by your health care provider. Do not stop taking the antibiotic even if you start to feel better. Incision Care  Follow instructions from your health care provider about how to take care of your incision. Make sure you:  Wash your hands with soap and water before you change your bandage (dressing). If soap and water are not available, use hand sanitizer.  Change your dressing as told by your health care provider.  Leave stitches (sutures), skin glue, or adhesive strips in place. These skin closures may need to be in place for 2 weeks or longer. If adhesive strip edges start to loosen and curl up, you may trim the loose edges. Do not remove adhesive strips completely unless your health care provider tells you to do that.  Keep your incision clean and dry. Do not take baths, swim, or use a hot tub until your health care provider approves.  Check your incision and the surrounding area every day for redness, swelling, and leaking fluid. Physical Activity  Return to your  normal activities as told by your health care provider. Ask your health care provider what activities are safe for you. Rest and protect your back as much as possible.  Follow instructions from your health care provider about how to move and use good posture to help your spine heal.  Do not lift anything that is heavier than 8 lb (3.6 kg)or the limit that your health care provider tells you until he or she says that it is safe. Avoid lifting anything over your head.  Do not twist or bend at the waist until your health care provider approves.  Avoid pushing and pulling motions.  Avoid sitting or lying down in the same position for long periods of time.  Do not begin exercising until told by your health care provider. Ask your health care provider what kinds of exercise you can do to make your back stronger. General Instructions  If you were given a brace, use it as told by your health care provider.  Wear compression stockings as told by your health care provider. These stockings help to prevent blood clots and reduce swelling in your legs.  Do not use tobacco products, including cigarettes, chewing tobacco, or e-cigarettes. If you need help quitting, ask your health care provider.  Keep all follow-up visits as told by your health care provider and, if necessary, your physical therapist. This is important. SEEK MEDICAL CARE IF:  You have pain that gets worse or does not get better with medicine.  Your legs or feet become painful or swollen.  You have redness, swelling, or  pain at the site of your incision.  You have fluid, blood, or pus coming from your incision.  You vomit or feel nauseous.  You have weakness or numbness in your legs that is new or getting worse.  You have a fever.  You have trouble controlling urination or bowel movements. SEEK IMMEDIATE MEDICAL CARE IF:   You have severe pain.  You have chest pain.  You have trouble breathing.  You develop a  cough. These symptoms may represent a serious problem that is an emergency. Do not wait to see if the symptoms will go away. Get medical help right away. Call your local emergency services (911 in the [place] Do not drive yourself to the hospital. This information is not intended to replace advice given to you by your health care provider. Make sure you discuss any questions you have with your health care provider. Document Released: 08/01/2004 Document Revised: 05/06/2015 Document Reviewed: 06/27/2014 Elsevier Interactive Patient Education  2017 [employer].

## 2016-01-13 ENCOUNTER — Encounter (HOSPITAL_COMMUNITY): Payer: Self-pay | Admitting: Neurological Surgery

## 2016-01-13 NOTE — Anesthesia Postprocedure Evaluation (Signed)
Anesthesia Post Note  Patient: Brian Hardy  Procedure(s) Performed: Procedure(s) (LRB): LUMBAR FOUR-FIVE MAXIMUM ACCESS SURGERY POSTERIOR LUMBAR INTERBODY FUSION (N/A)  Patient location during evaluation: PACU Anesthesia Type: General Level of consciousness: sedated Pain management: satisfactory to patient Vital Signs Assessment: post-procedure vital signs reviewed and stable Respiratory status: spontaneous breathing Cardiovascular status: stable Anesthetic complications: no       Last Vitals:  Vitals:   01/11/16 0544 01/11/16 0940  BP: 120/74 122/72  Pulse: 80 77  Resp: 16 18  Temp: 36.3 C 36.6 C    Last Pain:  Vitals:   01/11/16 1111  TempSrc:   PainSc: [place]

## 2016-01-17 ENCOUNTER — Encounter (HOSPITAL_COMMUNITY): Payer: Self-pay | Admitting: Neurological Surgery

## 2017-01-28 ENCOUNTER — Ambulatory Visit
Admission: RE | Admit: 2017-01-28 | Discharge: 2017-01-28 | Disposition: A | Discharge status: Home / Self Care | Payer: Medicare Other | Source: Ambulatory Visit | Attending: Radiation Oncology | Admitting: Radiation Oncology

## 2017-01-28 ENCOUNTER — Other Ambulatory Visit: Payer: Self-pay

## 2017-01-28 ENCOUNTER — Encounter: Payer: Self-pay | Admitting: Medical Oncology

## 2017-01-28 ENCOUNTER — Encounter: Payer: Self-pay | Admitting: Radiation Oncology

## 2017-01-28 DIAGNOSIS — Z9889 Other specified postprocedural states: Secondary | ICD-10-CM | POA: Insufficient documentation

## 2017-01-28 DIAGNOSIS — Z79899 Other long term (current) drug therapy: Secondary | ICD-10-CM | POA: Insufficient documentation

## 2017-01-28 DIAGNOSIS — Z882 Allergy status to sulfonamides status: Secondary | ICD-10-CM | POA: Insufficient documentation

## 2017-01-28 DIAGNOSIS — C61 Malignant neoplasm of prostate: Secondary | ICD-10-CM | POA: Diagnosis not present

## 2017-01-28 DIAGNOSIS — M1990 Unspecified osteoarthritis, unspecified site: Secondary | ICD-10-CM | POA: Insufficient documentation

## 2017-01-28 HISTORY — DX: Malignant neoplasm of prostate: C61

## 2017-01-28 HISTORY — DX: Induration penis plastica: N48.6

## 2017-01-28 NOTE — Progress Notes (Signed)
Radiation Oncology         (336) [phone] ________________________________  Initial outpatient Consultation  Name: Brian Hardy MRN: [id number]  Date: 01/28/2017  DOB: [birth year]  UX:NATFTDD, Brian Alert, MD  Franchot Gallo, MD   REFERRING PHYSICIAN: Franchot Gallo, MD  DIAGNOSIS: 82 y.o. gentleman with stage T1c adenocarcinoma of the prostate with a Gleason's score of 3+4 and a PSA of 6.35    ICD-10-CM   1. Malignant neoplasm of prostate ([place]) [name] ILLNESS::Brian Hardy is a 82 y.o. gentleman.  He has a longstanding history of elevated PSA dating back to 2010 when his PSA was 7.4.  He was referred at that time, by his PCP, Dr. Deforest Hoyles, for evaluation in urology by Dr. Diona Fanti.  A repeat PSA a few weeks later was 3.7 despite no treatment.  In May 2011, his PSA was 5.01 but again decreased to 2.85 on repeat evaluation In Nov. 2011.  He reports taking finasteride for approximately 18-24 months with dramatic decrease in his PSA to 0.63.  Off finasteride, his PSA normalized to 3.31 in 05/2013.  However, a recent PSA on 09/12/16 was 6.43 so he was referred back to Urology for further evaluation.  He saw Dr. Diona Fanti on 11/24/16,  digital rectal examination was performed at that time revealing symmetrical prostate lobes without nodularity.  The PSA was repeated at that visit and remained elevated at 6.35.  Therefore, the patient proceeded to transrectal ultrasound with 12 biopsies of the prostate on 12/23/16.  The prostate volume measured 47.5 cc.  Out of 12 core biopsies,5 were positive.  The maximum Gleason score was 3+4, and this was seen in left apex lateral, left mid lateral, left base lateral, and left apex.  There was a single focus of Gleason 3+3 disease in the left mid gland.   The patient reviewed the biopsy results with his urologist and he has kindly been referred today for discussion of potential radiation treatment options.   PSA trend: 10/30/16 PSA  6.35 09/02/16 PSA 6.43 06/14/13 PSA 3.31 01/14/11 PSA 2.06 (finasteride) 12/16/09 PSA 0.63 (finasteride) 06/14/09 PSA 5.01 04/12/08 PSA 3.72  PREVIOUS RADIATION THERAPY: No  PAST MEDICAL HISTORY:  has a past medical history of Arthritis, Peyronie's disease, and Prostate cancer ([place]).    PAST SURGICAL HISTORY: Past Surgical History:  Procedure Laterality Date  . BACK SURGERY    . COLONOSCOPY WITH PROPOFOL N/A 10/03/2013   Procedure: COLONOSCOPY WITH PROPOFOL;  Surgeon: Garlan Fair, MD;  Location: [facility];  Service: Endoscopy;  Laterality: N/A;  . cyst removal     behind knee  . HERNIA REPAIR  2006  . INGUINAL HERNIA REPAIR  2013  . MAXIMUM ACCESS (MAS)POSTERIOR LUMBAR INTERBODY FUSION (PLIF) 1 LEVEL N/A 01/10/2016   Procedure: LUMBAR FOUR-FIVE MAXIMUM ACCESS SURGERY POSTERIOR LUMBAR INTERBODY FUSION;  Surgeon: Eustace Moore, MD;  Location: [facility];  Service: Neurosurgery;  Laterality: N/A;  LUMBAR FOUR-FIVE MAXIMUM ACCESS SURGERY POSTERIOR LUMBAR INTERBODY FUSION   . PROSTATE BIOPSY    . SPINE SURGERY  2009-2010    FAMILY HISTORY: family history includes Cancer in his father; Dementia in his mother and sister; Pneumonia in his father.  SOCIAL HISTORY:  reports that he quit smoking about 33 years ago. His smoking use included cigarettes. He has a 30.00 pack-year smoking history. he has never used smokeless tobacco. He reports that he drinks about 3.6 oz of alcohol per week. He reports that he does not use  drugs.  ALLERGIES: Sulfa antibiotics  MEDICATIONS:  Current Outpatient Medications  Medication Sig Dispense Refill  . celecoxib (CELEBREX) 200 MG capsule Take 200 mg by mouth 2 (two) times daily.  5   No current facility-administered medications for this encounter.     REVIEW OF SYSTEMS:  On review of systems, the patient reports that he is doing well overall. He denies any chest pain, shortness of breath, cough, fevers, chills, night sweats, unintended weight changes.  He denies any bowel or bladder disturbances, and denies abdominal pain, nausea or vomiting. He denies any new musculoskeletal or joint aches or pains, new skin lesions or concerns. He reports chronic cervical and lumbar spine pain managed with Celebrex. He is s/p spinal fusion on his lower back over a year ago. The patient completed an IPSS and IIEF questionnaire.  His IPSS score was 9 indicating mild-moderate urinary outflow obstructive symptoms.  He denies dysuria, hematuria, leakage, or incontinence and feels that he is able to empty his bladder well on voiding. He denies ED.  A complete review of systems is obtained and is otherwise negative.    PHYSICAL EXAM:  height is 5\' 7"  (1.702 m) and weight is 151 lb 12.8 oz (68.9 kg). His oral temperature is 98.4 F (36.9 C). His blood pressure is 160/80 (abnormal) and his pulse is 61. His respiration is 16 and oxygen saturation is 100%.   In general this is a well appearing caucasian male in no acute distress. He is Hardy and oriented x4 and appropriate throughout the examination. HEENT reveals that the patient is normocephalic, atraumatic. EOMs are intact. PERRLA. Skin is intact without any evidence of gross lesions. Cardiovascular exam reveals a regular rate and rhythm, no clicks rubs or murmurs are auscultated. Chest is clear to auscultation bilaterally. Lymphatic assessment is performed and does not reveal any adenopathy in the cervical, supraclavicular, axillary, or inguinal chains. Abdomen has active bowel sounds in all quadrants and is intact. The abdomen is soft, non tender, non distended. Lower extremities are negative for pretibial pitting edema, deep calf tenderness, cyanosis or clubbing.  KPS = 100   100 - Normal; no complaints; no evidence of disease. 90   - Able to carry on normal activity; minor signs or symptoms of disease. 80   - Normal activity with effort; some signs or symptoms of disease. 77   - Cares for self; unable to carry on normal  activity or to do active work. 60   - Requires occasional assistance, but is able to care for most of his personal needs. 50   - Requires considerable assistance and frequent medical care. 63   - Disabled; requires special care and assistance. 44   - Severely disabled; hospital admission is indicated although death not imminent. 49   - Very sick; hospital admission necessary; active supportive treatment necessary. 10   - Moribund; fatal processes progressing rapidly. 0     - Dead  Karnofsky DA, Abelmann Cornelia, Craver LS and Burchenal [facility] [phone]) The use of the nitrogen mustards in the palliative treatment of carcinoma: with particular reference to bronchogenic carcinoma Cancer 1 634-56   LABORATORY DATA:  Lab Results  Component Value Date   WBC 6.7 01/03/2016   HGB 14.5 01/03/2016   HCT 42.5 01/03/2016   MCV 98.8 01/03/2016   PLT 175 01/03/2016   Lab Results  Component Value Date   NA 138 01/03/2016   K 4.1 01/03/2016   CL 109 01/03/2016   CO2 18 (L)  01/03/2016   No results found for: ALT, AST, GGT, ALKPHOS, BILITOT   RADIOGRAPHY: No results found.    IMPRESSION: This gentleman is 83 years old.  His T-Stage, Gleason's Score, and PSA put him into the favorable intermediate risk group.  Accordingly he is eligible for a variety of potential treatment options including external beam radiation and brachytherapy.  PLAN: Today, wereviewed the findings and workup thus far.  We discussed the natural history of prostate cancer.  We reviewed the the implications of T-stage, Gleason's Score, and PSA on decision-making and outcomes in prostate cancer.  We discussed radiation treatment in the management of prostate cancer with regard to the logistics and delivery of external beam radiation treatment as well as the logistics and delivery of prostate brachytherapy.  We also discussed the role of SpaceOAR in reducing the rectal toxicity associated with radiotherapy. We compared and contrasted each of  these approaches and also compared these against prostatectomy.  The patient expressed interest in prostate brachytherapy with placement of SpaceOAR.    At the conclusion of our conversation, the patient elects to proceed with prostate brachytherapy and placement of SpaceOAR.  We will share mour findings with Dr. Diona Fanti and move forward with scheduling the procedure in the near future.     We enjoyed meeting with him and his wife today, and will look forward to participating in the care of this very nice gentleman.   We spent time face to face with the patient and more than 50% of that time was spent in counseling and/or coordination of care.     Nicholos Johns, PA-C    Tyler Pita, MD  [facility] Direct Dial: [phone]  Fax: [phone] [email or web address]  Skype  [email or web address]    Page Me    This document serves as a record of services personally performed by Tyler Pita, MD and Freeman Caldron, PA-C. It was created on their behalf by Bethann Humble, a trained medical scribe. The creation of this record is based on the scribe's personal observations and the provider's statements to them. This document has been checked and approved by the attending provider.

## 2017-01-28 NOTE — Progress Notes (Signed)
See progress note under physician encounter. 

## 2017-01-28 NOTE — Progress Notes (Signed)
Introduced myself to Mr. Brian Hardy and his wife as the nurse navigator and my role. After discussion with Dr. Tammi Klippel and Dr.Dahlstedt, he would like to proceed with seed implant. I will continue to follow and asked him to call with questions or concerns. He voiced understanding.

## 2017-01-28 NOTE — Progress Notes (Signed)
GU Location of Tumor / Histology: prostatic adenocarcinoma  If Prostate Cancer, Gleason Score is (3 + 4) and PSA is (6.35). Prostate volume: 47.5 cc.   Brian Hardy was referred by Dr. Lysle Rubens to Dr. Diona Fanti in October 2018 for evaluation of an elevated PSA.   10/30/16 PSA  6.35 09/02/16  PSA  6.43 06/14/13 PSA  3.31 06/15/12 PSA  2.06 01/14/11 PSA  0.63 (finasteride) 12/16/09 PSA  2.85 06/14/09 PSA  5.01 04/12/08 PSA  3.72  Biopsies of prostate (if applicable) revealed:    Past/Anticipated interventions by urology, if any: prostate biopsy, referral to radiation oncology for consideration of brachytherapy  Past/Anticipated interventions by medical oncology, if any: no  Weight changes, if any: no  Bowel/Bladder complaints, if any: IPSS 9. Denies dysuria, hematuria, leakage or incontinence.    Nausea/Vomiting, if any: no  Pain issues, if any:  Reports cervical and lumbar spine pain managed with Celebrex. Patient had spinal fusion on his lower back over a year ago.   SAFETY ISSUES:  Prior radiation? no  Pacemaker/ICD? no  Possible current pregnancy? no  Is the patient on methotrexate? no  Current Complaints / other details:  82 year old male. Retired.Ax: Rapaflo and Sulfa.

## 2017-02-02 ENCOUNTER — Telehealth: Payer: Self-pay | Admitting: *Deleted

## 2017-02-02 NOTE — Telephone Encounter (Signed)
CALLED PATIENT TO INFORM OF PRE-SEED PLANNING CT FOR 02-26-17, SPOKE WITH PATIENT AND HE IS AWARE OF THIS APPT.

## 2017-02-10 ENCOUNTER — Other Ambulatory Visit: Payer: Self-pay | Admitting: Urology

## 2017-02-10 ENCOUNTER — Telehealth: Payer: Self-pay | Admitting: *Deleted

## 2017-02-10 NOTE — Telephone Encounter (Signed)
Called patient to inform of implant date, spoke with patient and he is aware of this implant date 

## 2017-02-25 ENCOUNTER — Telehealth: Payer: Self-pay | Admitting: *Deleted

## 2017-02-25 NOTE — Telephone Encounter (Signed)
CALLED PATIENT TO REMIND OF APPTS. FOR 02-26-17, SPOKE WITH PATIENT AND HE IS AWARE OF THESE APPTS.

## 2017-02-26 ENCOUNTER — Ambulatory Visit (HOSPITAL_COMMUNITY)
Admission: RE | Admit: 2017-02-26 | Discharge: 2017-02-26 | Disposition: A | Discharge status: Home / Self Care | Payer: Medicare Other | Source: Ambulatory Visit | Attending: Urology | Admitting: Urology

## 2017-02-26 ENCOUNTER — Encounter: Payer: Self-pay | Admitting: Medical Oncology

## 2017-02-26 ENCOUNTER — Ambulatory Visit
Admission: RE | Admit: 2017-02-26 | Discharge: 2017-02-26 | Disposition: A | Discharge status: Home / Self Care | Payer: Medicare Other | Source: Ambulatory Visit | Attending: Radiation Oncology | Admitting: Radiation Oncology

## 2017-02-26 ENCOUNTER — Encounter (HOSPITAL_COMMUNITY)
Admission: RE | Admit: 2017-02-26 | Discharge: 2017-02-26 | Disposition: A | Discharge status: Home / Self Care | Payer: Medicare Other | Source: Ambulatory Visit | Attending: Urology | Admitting: Urology

## 2017-02-26 DIAGNOSIS — Z01818 Encounter for other preprocedural examination: Secondary | ICD-10-CM | POA: Insufficient documentation

## 2017-02-26 DIAGNOSIS — I444 Left anterior fascicular block: Secondary | ICD-10-CM | POA: Diagnosis not present

## 2017-02-26 DIAGNOSIS — C61 Malignant neoplasm of prostate: Secondary | ICD-10-CM | POA: Insufficient documentation

## 2017-02-26 DIAGNOSIS — R001 Bradycardia, unspecified: Secondary | ICD-10-CM | POA: Diagnosis not present

## 2017-02-26 DIAGNOSIS — Z510 Encounter for antineoplastic radiation therapy: Secondary | ICD-10-CM | POA: Insufficient documentation

## 2017-02-26 NOTE — Progress Notes (Signed)
  Radiation Oncology         (336) [phone] ________________________________  Name: Brian Hardy MRN: [id number]  Date: 02/26/2017  DOB: May 12, 1935  SIMULATION AND TREATMENT PLANNING NOTE PUBIC ARCH STUDY  WF:UXNATFT, Ursula Alert, MD  Garlan Fair, MD  DIAGNOSIS:   82 y.o. gentleman with stage T1c adenocarcinoma of the prostate with a Gleason's score of 3+4 and a PSA of 6.35.      ICD-10-CM   1. Malignant neoplasm of prostate ([place]) C61     COMPLEX SIMULATION:  The patient presented today for evaluation for possible prostate seed implant. He was brought to the radiation planning suite and placed supine on the CT couch. A 3-dimensional image study set was obtained in upload to the planning computer. There, on each axial slice, I contoured the prostate gland. Then, using three-dimensional radiation planning tools I reconstructed the prostate in view of the structures from the transperineal needle pathway to assess for possible pubic arch interference. In doing so, I did not appreciate any pubic arch interference. Also, the patient's prostate volume was estimated based on the drawn structure. The volume was 43 cc.  Given the pubic arch appearance and prostate volume, patient remains a good candidate to proceed with prostate seed implant. Today, he freely provided informed written consent to proceed.    PLAN: The patient will undergo prostate seed implant 145 Gy.   ________________________________  Sheral Apley. Tammi Klippel, M.D.     This document serves as a record of services personally performed by Tyler Pita MD. It was created on his behalf by Delton Coombes, a trained medical scribe. The creation of this record is based on the scribe's personal observations and the provider's statements to them.

## 2017-03-16 ENCOUNTER — Other Ambulatory Visit: Payer: Self-pay | Admitting: Urology

## 2017-03-16 DIAGNOSIS — C61 Malignant neoplasm of prostate: Secondary | ICD-10-CM

## 2017-04-15 ENCOUNTER — Other Ambulatory Visit: Payer: Self-pay

## 2017-04-15 ENCOUNTER — Encounter (HOSPITAL_BASED_OUTPATIENT_CLINIC_OR_DEPARTMENT_OTHER): Payer: Self-pay

## 2017-04-15 ENCOUNTER — Telehealth: Payer: Self-pay | Admitting: *Deleted

## 2017-04-15 ENCOUNTER — Ambulatory Visit (HOSPITAL_COMMUNITY): Payer: Medicare Other

## 2017-04-15 NOTE — Telephone Encounter (Signed)
Called patient to remind of labs for 04-19-17 @ 2 pm @ [facility] Admitting, pt. to arrive @ 1:45 pm, spoke with patient and he is aware of this appt.

## 2017-04-15 NOTE — Progress Notes (Signed)
Spoke with:  Brian Hardy NPO:  No food after midnight/Clear liquids until 8:00 AM DOS Arrival time: 12Noon Labs: [place] AM (CXR/EKG done 02/26/2017, CBC, CMP, PT, PTT done 04/19/2017) AM medications:   None Pre op orders:  Yes Ride home:  Vaughan Basta (wife) [phone]

## 2017-04-19 ENCOUNTER — Encounter (HOSPITAL_COMMUNITY)
Admission: RE | Admit: 2017-04-19 | Discharge: 2017-04-19 | Disposition: A | Discharge status: Home / Self Care | Payer: Medicare Other | Source: Ambulatory Visit | Attending: Urology | Admitting: Urology

## 2017-04-19 DIAGNOSIS — Z01812 Encounter for preprocedural laboratory examination: Secondary | ICD-10-CM | POA: Diagnosis not present

## 2017-04-19 LAB — PROTIME-INR
INR: 0.95
Prothrombin Time: 12.5 seconds (ref 11.4–15.2)

## 2017-04-19 LAB — COMPREHENSIVE METABOLIC PANEL
ALBUMIN: 4.2 g/dL (ref 3.5–5.0)
ALK PHOS: 45 U/L (ref 38–126)
ALT: 18 U/L (ref 17–63)
AST: 20 U/L (ref 15–41)
Anion gap: 9 (ref 5–15)
BUN: 19 mg/dL (ref 6–20)
CALCIUM: 9.3 mg/dL (ref 8.9–10.3)
CHLORIDE: 107 mmol/L (ref 101–111)
CO2: 25 mmol/L (ref 22–32)
CREATININE: 0.77 mg/dL (ref 0.61–1.24)
GFR calc Af Amer: >60 mL/min (ref >60)
GFR calc non Af Amer: >60 mL/min (ref >60)
GLUCOSE: 89 mg/dL (ref 65–99)
Potassium: 3.9 mmol/L (ref 3.5–5.1)
SODIUM: 141 mmol/L (ref 135–145)
Total Bilirubin: 0.7 mg/dL (ref 0.3–1.2)
Total Protein: 7.1 g/dL (ref 6.5–8.1)

## 2017-04-19 LAB — CBC
HCT: 40.7 % (ref 39.0–52.0)
HEMOGLOBIN: 13.6 g/dL (ref 13.0–17.0)
MCH: 33 pg (ref 26.0–34.0)
MCHC: 33.4 g/dL (ref 30.0–36.0)
MCV: 98.8 fL (ref 78.0–100.0)
PLATELETS: 213 10*3/uL (ref 150–400)
RBC: 4.12 MIL/uL — AB (ref 4.22–5.81)
RDW: 12.5 % (ref 11.5–15.5)
WBC: 7.3 10*3/uL (ref 4.0–10.5)

## 2017-04-19 LAB — APTT: APTT: 27 s (ref 24–36)

## 2017-04-23 ENCOUNTER — Telehealth: Payer: Self-pay | Admitting: *Deleted

## 2017-04-23 NOTE — Telephone Encounter (Signed)
Called patient to remind of procedure for 04-26-17, spoke with patient and he is aware of this procedure

## 2017-04-25 NOTE — Progress Notes (Signed)
  Radiation Oncology         (336) [phone] ________________________________  Name: Brian Hardy MRN: [id number]  Date: 04/25/2017  DOB: Nov 26, 1935       Prostate Seed Implant  RX:YVOPFY, Denton Ar, MD  No ref. provider found  DIAGNOSIS: 82 y.o. gentleman with stage T1c adenocarcinoma of the prostate with a Gleason's score of 3+4 and a PSA of 6.35    ICD-10-CM   1. Malignant neoplasm of prostate ([place]) C61    PROCEDURE: Insertion of radioactive I-125 seeds into the prostate gland.  RADIATION DOSE: 145 Gy, definitive therapy.  TECHNIQUE: Brian Hardy was brought to the operating room with the urologist. He was placed in the dorsolithotomy position. He was catheterized and a rectal tube was inserted. The perineum was shaved, prepped and draped. The ultrasound probe was then introduced into the rectum to see the prostate gland.  TREATMENT DEVICE: A needle grid was attached to the ultrasound probe stand and anchor needles were placed.  3D PLANNING: The prostate was imaged in 3D using a sagittal sweep of the prostate probe. These images were transferred to the planning computer. There, the prostate, urethra and rectum were defined on each axial reconstructed image. Then, the software created an optimized 3D plan and a few seed positions were adjusted. The quality of the plan was reviewed using [facility] information for the target and the following two organs at risk:  Urethra and Rectum.  Then the accepted plan was uploaded to the seed Selectron afterloading unit.  PROSTATE VOLUME STUDY:  Using transrectal ultrasound the volume of the prostate was verified to be 47.44 cc.  SPECIAL TREATMENT PROCEDURE/SUPERVISION AND HANDLING: The Nucletron FIRST system was used to place the needles under sagittal guidance. A total of 21 needles were used to deposit 71 seeds in the prostate gland. The individual seed activity was 0.52 mCi.  SpaceOAR:  Yes  COMPLEX SIMULATION: At the end of the procedure, an anterior  radiograph of the pelvis was obtained to document seed positioning and count. Cystoscopy was performed to check the urethra and bladder.  MICRODOSIMETRY: At the end of the procedure, the patient was emitting 0.310 mR/hr at 1 meter. Accordingly, he was considered safe for hospital discharge.  PLAN: The patient will return to the radiation oncology clinic for post implant CT dosimetry in three weeks.   ________________________________  Sheral Apley Tammi Klippel, M.D.

## 2017-04-26 ENCOUNTER — Ambulatory Visit (HOSPITAL_BASED_OUTPATIENT_CLINIC_OR_DEPARTMENT_OTHER): Payer: Medicare Other | Admitting: Anesthesiology

## 2017-04-26 ENCOUNTER — Other Ambulatory Visit: Payer: Self-pay

## 2017-04-26 ENCOUNTER — Ambulatory Visit (HOSPITAL_COMMUNITY): Payer: Medicare Other

## 2017-04-26 ENCOUNTER — Ambulatory Visit (HOSPITAL_BASED_OUTPATIENT_CLINIC_OR_DEPARTMENT_OTHER)
Admission: RE | Admit: 2017-04-26 | Discharge: 2017-04-26 | Disposition: A | Discharge status: Home / Self Care | Payer: Medicare Other | Source: Ambulatory Visit | Attending: Urology | Admitting: Urology

## 2017-04-26 ENCOUNTER — Encounter (HOSPITAL_BASED_OUTPATIENT_CLINIC_OR_DEPARTMENT_OTHER): Payer: Self-pay | Admitting: *Deleted

## 2017-04-26 ENCOUNTER — Encounter (HOSPITAL_BASED_OUTPATIENT_CLINIC_OR_DEPARTMENT_OTHER)
Admission: RE | Disposition: A | Discharge status: Home / Self Care | Payer: Self-pay | Source: Ambulatory Visit | Attending: Urology

## 2017-04-26 DIAGNOSIS — Z87891 Personal history of nicotine dependence: Secondary | ICD-10-CM | POA: Diagnosis not present

## 2017-04-26 DIAGNOSIS — C61 Malignant neoplasm of prostate: Secondary | ICD-10-CM | POA: Insufficient documentation

## 2017-04-26 HISTORY — DX: Diverticulitis of intestine, part unspecified, without perforation or abscess without bleeding: K57.92

## 2017-04-26 HISTORY — PX: SPACE OAR INSTILLATION: SHX6769

## 2017-04-26 HISTORY — PX: RADIOACTIVE SEED IMPLANT: SHX5150

## 2017-04-26 SURGERY — INSERTION, RADIATION SOURCE, PROSTATE
Anesthesia: General

## 2017-04-26 MED ORDER — LIDOCAINE 2% (20 MG/ML) 5 ML SYRINGE
INTRAMUSCULAR | Status: DC | PRN
  Administered 2017-04-26: 60 mg via INTRAVENOUS

## 2017-04-26 MED ORDER — OXYCODONE HCL 5 MG PO TABS
5.0000 mg | ORAL_TABLET | Freq: Once | ORAL | Status: DC | PRN
  Filled 2017-04-26: qty 1

## 2017-04-26 MED ORDER — IOHEXOL 300 MG/ML  SOLN
INTRAMUSCULAR | Status: DC | PRN
  Administered 2017-04-26: 7 mL via URETHRAL

## 2017-04-26 MED ORDER — FENTANYL CITRATE (PF) 100 MCG/2ML IJ SOLN
INTRAMUSCULAR | Status: DC | PRN
  Administered 2017-04-26: 50 ug via INTRAVENOUS

## 2017-04-26 MED ORDER — KETOROLAC TROMETHAMINE 30 MG/ML IJ SOLN
INTRAMUSCULAR | Status: AC
  Filled 2017-04-26: qty 1

## 2017-04-26 MED ORDER — FENTANYL CITRATE (PF) 100 MCG/2ML IJ SOLN
INTRAMUSCULAR | Status: AC
  Filled 2017-04-26: qty 2

## 2017-04-26 MED ORDER — ARTIFICIAL TEARS OPHTHALMIC OINT
TOPICAL_OINTMENT | OPHTHALMIC | Status: AC
  Filled 2017-04-26: qty 3.5

## 2017-04-26 MED ORDER — DEXAMETHASONE SODIUM PHOSPHATE 10 MG/ML IJ SOLN
INTRAMUSCULAR | Status: DC | PRN
  Administered 2017-04-26: 10 mg via INTRAVENOUS

## 2017-04-26 MED ORDER — FENTANYL CITRATE (PF) 100 MCG/2ML IJ SOLN
25.0000 ug | INTRAMUSCULAR | Status: DC | PRN
  Filled 2017-04-26: qty 1

## 2017-04-26 MED ORDER — OXYCODONE HCL 5 MG/5ML PO SOLN
5.0000 mg | Freq: Once | ORAL | Status: DC | PRN
  Filled 2017-04-26: qty 5

## 2017-04-26 MED ORDER — FLEET ENEMA 7-19 GM/118ML RE ENEM
1.0000 | ENEMA | Freq: Once | RECTAL | Status: AC
  Administered 2017-04-26: 1 via RECTAL
  Filled 2017-04-26: qty 1

## 2017-04-26 MED ORDER — CEFAZOLIN SODIUM-DEXTROSE 2-4 GM/100ML-% IV SOLN
2.0000 g | Freq: Once | INTRAVENOUS | Status: AC
  Administered 2017-04-26: 2 g via INTRAVENOUS
  Filled 2017-04-26: qty 100

## 2017-04-26 MED ORDER — ONDANSETRON HCL 4 MG/2ML IJ SOLN
INTRAMUSCULAR | Status: DC | PRN
  Administered 2017-04-26: 4 mg via INTRAVENOUS

## 2017-04-26 MED ORDER — KETOROLAC TROMETHAMINE 30 MG/ML IJ SOLN
INTRAMUSCULAR | Status: DC | PRN
  Administered 2017-04-26: 30 mg via INTRAVENOUS

## 2017-04-26 MED ORDER — PROPOFOL 10 MG/ML IV BOLUS
INTRAVENOUS | Status: AC
  Filled 2017-04-26: qty 20

## 2017-04-26 MED ORDER — KETOROLAC TROMETHAMINE 30 MG/ML IJ SOLN
15.0000 mg | Freq: Once | INTRAMUSCULAR | Status: DC | PRN
  Filled 2017-04-26: qty 1

## 2017-04-26 MED ORDER — LACTATED RINGERS IV SOLN
INTRAVENOUS | Status: DC
  Administered 2017-04-26: 13:00:00 via INTRAVENOUS
  Filled 2017-04-26: qty 1000

## 2017-04-26 MED ORDER — PROMETHAZINE HCL 25 MG/ML IJ SOLN
6.2500 mg | INTRAMUSCULAR | Status: DC | PRN
  Filled 2017-04-26: qty 1

## 2017-04-26 MED ORDER — ONDANSETRON HCL 4 MG/2ML IJ SOLN
INTRAMUSCULAR | Status: AC
  Filled 2017-04-26: qty 2

## 2017-04-26 MED ORDER — STERILE WATER FOR IRRIGATION IR SOLN
Status: DC | PRN
  Administered 2017-04-26: 3 mL

## 2017-04-26 MED ORDER — CEFAZOLIN SODIUM-DEXTROSE 2-4 GM/100ML-% IV SOLN
INTRAVENOUS | Status: AC
  Filled 2017-04-26: qty 100

## 2017-04-26 MED ORDER — SODIUM CHLORIDE 0.9 % IV SOLN
INTRAVENOUS | Status: AC | PRN
  Administered 2017-04-26: 1000 mL

## 2017-04-26 MED ORDER — PROPOFOL 10 MG/ML IV BOLUS
INTRAVENOUS | Status: DC | PRN
  Administered 2017-04-26: 170 mg via INTRAVENOUS

## 2017-04-26 MED ORDER — DEXAMETHASONE SODIUM PHOSPHATE 10 MG/ML IJ SOLN
INTRAMUSCULAR | Status: AC
  Filled 2017-04-26: qty 1

## 2017-04-26 SURGICAL SUPPLY — 27 items
BAG URINE DRAINAGE (UROLOGICAL SUPPLIES) ×3 IMPLANT
BLADE CLIPPER SURG (BLADE) ×3 IMPLANT
CATH FOLEY 2WAY SLVR  5CC 16FR (CATHETERS) ×2
CATH FOLEY 2WAY SLVR 5CC 16FR (CATHETERS) ×1 IMPLANT
CATH ROBINSON RED A/P 16FR (CATHETERS) IMPLANT
CATH ROBINSON RED A/P 20FR (CATHETERS) ×3 IMPLANT
CLOTH BEACON ORANGE TIMEOUT ST (SAFETY) ×3 IMPLANT
COVER BACK TABLE 60X90IN (DRAPES) ×3 IMPLANT
COVER MAYO STAND STRL (DRAPES) ×3 IMPLANT
DRSG TEGADERM 4X4.75 (GAUZE/BANDAGES/DRESSINGS) ×4 IMPLANT
DRSG TEGADERM 8X12 (GAUZE/BANDAGES/DRESSINGS) ×6 IMPLANT
GAUZE SPONGE 4X4 12PLY STRL LF (GAUZE/BANDAGES/DRESSINGS) ×2 IMPLANT
GLOVE BIO SURGEON STRL SZ8 (GLOVE) ×6 IMPLANT
GLOVE ECLIPSE 8.0 STRL XLNG CF (GLOVE) ×3 IMPLANT
GOWN STRL REUS W/TWL XL LVL3 (GOWN DISPOSABLE) ×3 IMPLANT
HOLDER FOLEY CATH W/STRAP (MISCELLANEOUS) ×3 IMPLANT
IMPL SPACEOAR SYSTEM 10ML (MISCELLANEOUS) ×1 IMPLANT
IMPLANT SPACEOAR SYSTEM 10ML (MISCELLANEOUS) ×3
IV NS 1000ML (IV SOLUTION) ×3
IV NS 1000ML BAXH (IV SOLUTION) ×1 IMPLANT
KIT TURNOVER CYSTO (KITS) ×3 IMPLANT
PACK CYSTO (CUSTOM PROCEDURE TRAY) ×3 IMPLANT
SELECTSEED I-125 ×2 IMPLANT
SUT BONE WAX W31G (SUTURE) ×3 IMPLANT
SYRINGE 10CC LL (SYRINGE) ×3 IMPLANT
UNDERPAD 30X30 (UNDERPADS AND DIAPERS) ×6 IMPLANT
WATER STERILE IRR 500ML POUR (IV SOLUTION) ×3 IMPLANT

## 2017-04-26 NOTE — Anesthesia Postprocedure Evaluation (Signed)
Anesthesia Post Note  Patient: Brian Hardy  Procedure(s) Performed: RADIOACTIVE SEED IMPLANT/BRACHYTHERAPY IMPLANT (N/A ) SPACE OAR INSTILLATION (N/A )     Patient location during evaluation: PACU Anesthesia Type: General Level of consciousness: awake and alert Pain management: pain level controlled Vital Signs Assessment: post-procedure vital signs reviewed and stable Respiratory status: spontaneous breathing, nonlabored ventilation, respiratory function stable and patient connected to nasal cannula oxygen Cardiovascular status: blood pressure returned to baseline and stable Postop Assessment: no apparent nausea or vomiting Anesthetic complications: no    Last Vitals:  Vitals:   04/26/17 1645 04/26/17 1725  BP: (!) 157/72 (!) 168/65  Pulse: (!) 57 (!) 56  Resp: 12 16  Temp:  36.5 C  SpO2: 98% 100%    Last Pain:  Vitals:   04/26/17 1725  TempSrc: Oral  PainSc: 0-No pain                 Barnet [place]

## 2017-04-26 NOTE — Op Note (Signed)
Preoperative diagnosis: Clinical stage TI C adenocarcinoma the prostate   Postoperative diagnosis: Same   Procedure: I-125 prostate seed implantation with Nucletron robotic implanter, flexible cystoscopy  Surgeon: Lillette Boxer. Sanya Kobrin M.D.  Radiation Oncologist: Tyler Pita, M.D.  Anesthesia: Gen.   Indications: Patient  was diagnosed with clinical stage TI C prostate cancer. We had extensive discussion with him about treatment options versus. He elected to proceed with seed implantation. He underwent consultation my office as well as with Dr. Tammi Klippel. He appeared to understand the advantages disadvantages potential risks of this treatment option. Full informed consent has been obtained.   Technique and findings: Patient was brought the operating room where he had successful induction of general anesthesia. He was placed in dorso-lithotomy position and prepped and draped in usual manner. Appropriate surgical timeout was performed. Radiation oncology department placed a transrectal ultrasound probe anchoring stand. Foley catheter with contrast in the balloon was inserted without difficulty. Anchoring needles were placed within the prostate. Rectal tube was placed. Real-time contouring of the urethra prostate and rectum were performed and the dosing parameters were established. Targeted dose was 145 gray.  I was then called  to the operating suite suite for placement of the needles. A second timeout was performed. All needle passage was done with real-time transrectal ultrasound guidance with the sagittal plane. A total of 21 needles were placed. The implantation itself was done with the robotic implanter. 71 active seeds were implanted.  I then proceeded with placement of SpaceOARby introducing a needle with the bevel angled inferiorly approximately 2 cm superior to the anus. This was angled downward and under direct ultrasound was placed within the space between the prostatic capsule and  rectum. This was confirmed with a small amount of sterile saline injected and this was performed under direct ultrasound. I then attached the SpaceOARto the needle and injected this in the space between the prostate and rectum with good placement noted. The Foley catheter was removed and flexible cystoscopy failed to show any seeds outside the prostate.  The patient was brought to recovery room in stable condition, having tolerated the procedure well.Marland Kitchen

## 2017-04-26 NOTE — Interval H&P Note (Signed)
History and Physical Interval Note:  04/26/2017 2:02 PM  Brian Hardy  has presented today for surgery, with the diagnosis of PROSTATE CANCER  The various methods of treatment have been discussed with the patient and family. After consideration of risks, benefits and other options for treatment, the patient has consented to  Procedure(s): RADIOACTIVE SEED IMPLANT/BRACHYTHERAPY IMPLANT (N/A) SPACE OAR INSTILLATION (N/A) as a surgical intervention .  The patient's history has been reviewed, patient examined, no change in status, stable for surgery.  I have reviewed the patient's chart and labs.  Questions were answered to the patient's satisfaction.     Lillette Boxer Heavenlee Maiorana

## 2017-04-26 NOTE — Anesthesia Preprocedure Evaluation (Addendum)
Anesthesia Evaluation  Patient identified by MRN, date of birth, ID band Patient awake    Reviewed: Allergy & Precautions, NPO status , Patient's Chart, lab work & pertinent test results  Airway Mallampati: II  TM Distance: >3 FB Neck ROM: Full    Dental no notable dental hx. (+) Teeth Intact, Dental Advisory Given, Missing, Caps,    Pulmonary neg pulmonary ROS, former smoker,    Pulmonary exam normal breath sounds clear to auscultation       Cardiovascular negative cardio ROS Normal cardiovascular exam Rhythm:Regular Rate:Normal     Neuro/Psych negative neurological ROS  negative psych ROS   GI/Hepatic negative GI ROS, Neg liver ROS,   Endo/Other  negative endocrine ROS  Renal/GU negative Renal ROS  negative genitourinary   Musculoskeletal negative musculoskeletal ROS (+)   Abdominal   Peds negative pediatric ROS (+)  Hematology negative hematology ROS (+)   Anesthesia Other Findings   Reproductive/Obstetrics negative OB ROS                            Anesthesia Physical Anesthesia Plan  ASA: II  Anesthesia Plan: General   Post-op Pain Management:    Induction: Intravenous  PONV Risk Score and Plan: 2 and Ondansetron, Dexamethasone and Treatment may vary due to age or medical condition  Airway Management Planned: LMA  Additional Equipment:   Intra-op Plan:   Post-operative Plan: Extubation in OR  Informed Consent: I have reviewed the patients History and Physical, chart, labs and discussed the procedure including the risks, benefits and alternatives for the proposed anesthesia with the patient or authorized representative who has indicated his/her understanding and acceptance.   Dental advisory given  Plan Discussed with: CRNA and Surgeon  Anesthesia Plan Comments:         Anesthesia Quick Evaluation

## 2017-04-26 NOTE — H&P (Signed)
H&P  Chief Complaint: prostate cancer  History of Present Illness: 82 year old male presents today for brachytherapy for recently diagnosed prostate cancer.  TRUS/Bx performed on 12/23/2016   Prostatic volume 47.5 mL, PSA 6.5.PSA density 0.14.   5/12 cores, all from the left prostate positive for adenocarcinoma. 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   SHIM score 15     Past Medical History:  Diagnosis Date  . Arthritis   . Diverticulitis   . Peyronie's disease   . Prostate cancer [facility])     Past Surgical History:  Procedure Laterality Date  . COLONOSCOPY WITH PROPOFOL N/A 10/03/2013   Procedure: COLONOSCOPY WITH PROPOFOL;  Surgeon: Garlan Fair, MD;  Location: [facility];  Service: Endoscopy;  Laterality: N/A;  . cyst removal     behind knee X2  . HERNIA REPAIR  2006  . INGUINAL HERNIA REPAIR  2013  . MAXIMUM ACCESS (MAS)POSTERIOR LUMBAR INTERBODY FUSION (PLIF) 1 LEVEL N/A 01/10/2016   Procedure: LUMBAR FOUR-FIVE MAXIMUM ACCESS SURGERY POSTERIOR LUMBAR INTERBODY FUSION;  Surgeon: Eustace Moore, MD;  Location: [facility];  Service: Neurosurgery;  Laterality: N/A;  LUMBAR FOUR-FIVE MAXIMUM ACCESS SURGERY POSTERIOR LUMBAR INTERBODY FUSION   . PROSTATE BIOPSY    . SPINE SURGERY  2009-2010  . TONSILLECTOMY      Home Medications:    Allergies:  Allergies  Allergen Reactions  . Sulfa Antibiotics Hives and Swelling    Family History  Problem Relation Age of Onset  . Dementia Mother   . Pneumonia Father   . Cancer Father        questionable prostate or colon ca  . Dementia Sister     Social History:  reports that he quit smoking about 33 years ago. His smoking use included cigarettes. He has a 30.00 pack-year smoking history. He has never used smokeless tobacco. He reports that he drinks about 3.6 oz of  alcohol per week. He reports that he does not use drugs.  ROS: A complete review of systems was performed.  All systems are negative except for pertinent findings as noted.  Physical Exam:  Vital signs in last 24 hours: Temp:  [97.6 F (36.4 C)] 97.6 F (36.4 C) (04/01 1203) Pulse Rate:  [61] 61 (04/01 1203) Resp:  [18] 18 (04/01 1203) BP: (155)/(83) 155/83 (04/01 1203) SpO2:  [100 %] 100 % (04/01 1203) Weight:  [67 kg (147 lb 11.2 oz)] 67 kg (147 lb 11.2 oz) (04/01 1203) Constitutional:  Alert and oriented, No acute distress Cardiovascular: Regular rate and rhythm, No JVD Respiratory: Normal respiratory effort, Lungs clear bilaterally GI: Abdomen is soft, nontender, nondistended, no abdominal masses Genitourinary: No CVAT. Normal male phallus, testes are descended bilaterally and non-tender and without masses, scrotum is normal in appearance without lesions or masses, perineum is normal on inspection. Rectal: Normal sphincter tone, no rectal masses, prostate is non tender and without nodularity. Prostate size is estimated to be 50 cc Lymphatic: No lymphadenopathy Neurologic: Grossly intact, no focal deficits Psychiatric: Normal mood and affect  Laboratory Data:  No results for input(s): WBC, HGB, HCT, PLT in the last 72 hours.  No results for input(s): NA, K, CL, GLUCOSE, BUN, CALCIUM, CREATININE in the last 72 hours.  Invalid input(s): CO3   No results found for this  or any previous visit (from the past 24 hour(s)). No results found for this or any previous visit (from the past 240 hour(s)).  Renal Function: No results for input(s): CREATININE in the last 168 hours. Estimated Creatinine Clearance: 70 mL/min (by C-G formula based on SCr of 0.77 mg/dL).  Radiologic Imaging: No results found.  Impression/Assessment:  Prostate cancer, intermediate risk  Plan:  I-125 brachytherapy, placement of SpaceOar

## 2017-04-26 NOTE — Discharge Instructions (Signed)
Radioactive Seed Implant Home Care Instructions   Activity: Rest for the remainder of the day.  Do not drive or operate equipment today.  You may resume normal activities in a few days as instructed by your physician, without risk of harmful radiation exposure to those around you, provided you follow the time and distance precautions on the Radiation Oncology Instruction Sheet.   Meals: Drink plenty of lipuids and eat light foods, such as gelatin or soup this evening . You may return to normal meal plan tomorrow.    You may return to work as instructed by your physician.  Special Instruction:   If any seeds are found, use tweezers to pick up seeds and place in a glass container of any kind and bring to your physician's office.  Call your physician if any of these symptoms occur:   Persistent or heavy bleeding  Urine stream diminishes or stops completely after catheter is removed  Fever equal to or greater than 101 degrees F  Cloudy urine with a strong foul odor  Severe pain  You may feel some burning pain and/or hesitancy when you urinate after the catheter is removed.  These symptoms may increase over the next few weeks, but should diminish within forur to six weeks.  Applying moist heat to the lower abdomen or a hot tub bath may help relieve the pain.  If the discomfort becomes severe, please call your physician for additional medications.   Post Anesthesia Home Care Instructions  Activity: Get plenty of rest for the remainder of the day. A responsible individual must stay with you for 24 hours following the procedure.  For the next 24 hours, DO NOT: -Drive a car -Paediatric nurse -Drink alcoholic beverages -Take any medication unless instructed by your physician -Make any legal decisions or sign important papers.  Meals: Start with liquid foods such as gelatin or soup. Progress to regular foods as tolerated. Avoid greasy, spicy, heavy foods. If nausea and/or vomiting  occur, drink only clear liquids until the nausea and/or vomiting subsides. Call your physician if vomiting continues.  Special Instructions/Symptoms: Your throat may feel dry or sore from the anesthesia or the breathing tube placed in your throat during surgery. If this causes discomfort, gargle with warm salt water. The discomfort should disappear within 24 hours.

## 2017-04-26 NOTE — Anesthesia Procedure Notes (Signed)
Procedure Name: LMA Insertion Date/Time: 04/26/2017 2:33 PM Performed by: Wanita Chamberlain, CRNA Pre-anesthesia Checklist: Patient identified, Emergency Drugs available, Suction available, Patient being monitored and Timeout performed Patient Re-evaluated:Patient Re-evaluated prior to induction Oxygen Delivery Method: Circle system utilized Preoxygenation: Pre-oxygenation with 100% oxygen Induction Type: IV induction Ventilation: Mask ventilation without difficulty LMA: LMA inserted LMA Size: 4.0

## 2017-04-26 NOTE — Progress Notes (Signed)
Seed implant

## 2017-04-26 NOTE — Transfer of Care (Signed)
Immediate Anesthesia Transfer of Care Note  Patient: Brian Hardy  Procedure(s) Performed: RADIOACTIVE SEED IMPLANT/BRACHYTHERAPY IMPLANT (N/A ) SPACE OAR INSTILLATION (N/A )  Patient Location: PACU  Anesthesia Type:General  Level of Consciousness: awake, alert , oriented and patient cooperative  Airway & Oxygen Therapy: Patient Spontanous Breathing and Patient connected to nasal cannula oxygen  Post-op Assessment: Report given to RN and Post -op Vital signs reviewed and stable  Post vital signs: Reviewed and stable  Last Vitals:  Vitals Value Taken Time  BP    Temp    Pulse    Resp    SpO2      Last Pain:  Vitals:   04/26/17 1219  TempSrc:   PainSc: 0-No pain      Patients Stated Pain Goal: 3 (89/78/47 8412)  Complications: No apparent anesthesia complications

## 2017-04-27 ENCOUNTER — Encounter (HOSPITAL_BASED_OUTPATIENT_CLINIC_OR_DEPARTMENT_OTHER): Payer: Self-pay | Admitting: Urology

## 2017-05-05 ENCOUNTER — Telehealth: Payer: Self-pay | Admitting: *Deleted

## 2017-05-05 NOTE — Telephone Encounter (Signed)
CALLED PATIENT TO REMIND OF POST SEED APPTS. FOR 05-06-17 AND HIS MRI ON 05-08-17, SPOKE WITH PATIENT AND HE IS AWARE OF THESE APPTS.

## 2017-05-06 ENCOUNTER — Other Ambulatory Visit: Payer: Self-pay

## 2017-05-06 ENCOUNTER — Ambulatory Visit
Admission: RE | Admit: 2017-05-06 | Discharge: 2017-05-06 | Disposition: A | Discharge status: Home / Self Care | Payer: Medicare Other | Source: Ambulatory Visit | Attending: Radiation Oncology | Admitting: Radiation Oncology

## 2017-05-06 ENCOUNTER — Encounter: Payer: Self-pay | Admitting: Medical Oncology

## 2017-05-06 ENCOUNTER — Encounter: Payer: Self-pay | Admitting: Radiation Oncology

## 2017-05-06 VITALS — BP 128/75 | HR 71 | Temp 99.3°F | Resp 20 | Wt 149.6 lb

## 2017-05-06 DIAGNOSIS — Z923 Personal history of irradiation: Secondary | ICD-10-CM | POA: Insufficient documentation

## 2017-05-06 DIAGNOSIS — C61 Malignant neoplasm of prostate: Secondary | ICD-10-CM | POA: Insufficient documentation

## 2017-05-06 DIAGNOSIS — Z510 Encounter for antineoplastic radiation therapy: Secondary | ICD-10-CM | POA: Insufficient documentation

## 2017-05-06 DIAGNOSIS — Z882 Allergy status to sulfonamides status: Secondary | ICD-10-CM | POA: Diagnosis not present

## 2017-05-06 NOTE — Progress Notes (Signed)
Weight and vitals stable. Denies pain. Pre Seed IPSS 9. Post seed IPSS 13. Patient's biggest complaint is difficulty emptying his bladder completely since the seeds were implanted. Reports mild dysuria. Reports hematuria resolved. Denies urinary urgency or leakage. Scheduled for MRI to confirm spaceoar placement on Saturday. Scheduled to follow up with urologist on 05/17/2017  BP 128/75   Pulse 71   Temp 99.3 F (37.4 C) (Oral)   Resp 20   Wt 149 lb 9.6 oz (67.9 kg)   SpO2 98%   BMI 23.43 kg/m  Wt Readings from Last 3 Encounters:  05/06/17 149 lb 9.6 oz (67.9 kg)  04/26/17 147 lb 11.2 oz (67 kg)  01/28/17 151 lb 12.8 oz (68.9 kg)

## 2017-05-06 NOTE — Progress Notes (Signed)
Radiation Oncology         [phone] ________________________________  Name: SHREYANSH TIFFANY MRN: [id number]  Date: 05/06/2017  DOB: [birth year]  Post-Seed Follow-Up Visit Note  CC: Wenda Low, MD  Garlan Fair, MD  Diagnosis:   82 y.o. male with stage T1cadenocarcinoma of the prostate with a Gleason's score of 3+4and a PSA of 6.35    ICD-10-CM   1. Malignant neoplasm of prostate ([place]) C61     Interval Since Last Radiation:  1.5 weeks  04/26/17:  Insertion of radioactive I-125 seeds into the prostate gland, 145 Gy definitive therapy, with SpaceOAR  Narrative:  The patient returns today for routine follow-up.  He is complaining of increased urinary frequency and urinary hesitation symptoms. He filled out a questionnaire regarding urinary function today providing an overall IPSS score of 13 characterizing his symptoms as moderate. His pre-implant score was 9. His biggest complaint is difficulty emptying his bladder completely. He states that he has a strong stream the first time he voids followed by a strong sense of urgency within 2-3 minutes and only a dribble produced the second time. He reports mild dysuria at the start of his stream. He denies gross hematuria or straining to void. He also denies any bowel symptoms. He is scheduled for MRI to confirm Sampson placement on 05/08/2017.   ALLERGIES:  is allergic to sulfa antibiotics.  Meds: Current Outpatient Medications  Medication Sig Dispense Refill  . celecoxib (CELEBREX) 200 MG capsule Take 200 mg by mouth 2 (two) times daily.  5   No current facility-administered medications for this encounter.     Physical Findings: In general this is a well appearing caucasian male in no acute distress. He's alert and oriented x4 and appropriate throughout the examination. Cardiopulmonary assessment is negative for acute distress and he exhibits normal effort.   Lab Findings: Lab Results  Component Value Date   WBC 7.3  04/19/2017   HGB 13.6 04/19/2017   HCT 40.7 04/19/2017   MCV 98.8 04/19/2017   PLT 213 04/19/2017    Radiographic Findings:  Patient underwent CT imaging in our clinic for post implant dosimetry. The CT was reviewed by Dr. Tammi Klippel and appears to demonstrate an adequate distribution of radioactive seeds throughout the prostate gland. There are no seeds in or near the rectum. He is scheduled for MRI prostate on 05/08/17 and these images will be fused with his CT images for further review.  We suspect the final radiation plan and dosimetry will show appropriate coverage of the prostate gland.   Impression/Plan: The patient is recovering from the effects of radiation. His urinary symptoms should gradually improve over the next 4-6 months. We talked about this today. He is encouraged by his improvement already and is otherwise pleased with his outcome. We also talked about long-term follow-up for prostate cancer following seed implant. He understands that ongoing PSA determinations and digital rectal exams will help perform surveillance to rule out disease recurrence. He has a follow up appointment scheduled with Dr. Diona Fanti on 05/17/2017. He understands what to expect with his PSA measures. Patient was also educated today about some of the long-term effects from radiation including a small risk for rectal bleeding and possibly erectile dysfunction. We talked about some of the general management approaches to these potential complications. However, I did encourage the patient to contact our office or return at any point if he has questions or concerns related to his previous radiation and prostate cancer.  Nicholos Johns, PA-C    Tyler Pita, MD  [facility] Direct Dial: [phone]  Fax: [phone] [email or web address]  Skype  LinkedIn    Page Me   This document serves as a record of services personally performed by Tyler Pita, MD and Freeman Caldron, PA-C. It  was created on their behalf by Rae Lips, a trained medical scribe. The creation of this record is based on the scribe's personal observations and the providers' statements to them. This document has been checked and approved by the attending providers.

## 2017-05-06 NOTE — Progress Notes (Signed)
  Radiation Oncology         [phone] ________________________________  Name: Brian Hardy MRN: [id number]  Date: 05/06/2017  DOB: [birth year]  COMPLEX SIMULATION NOTE  NARRATIVE:  The patient was brought to the [place] suite today following prostate seed implantation approximately one month ago.  Identity was confirmed.  All relevant records and images related to the planned course of therapy were reviewed.  Then, the patient was set-up supine.  CT images were obtained.  The CT images were loaded into the planning software.  Then the prostate and rectum were contoured.  Treatment planning then occurred.  The implanted iodine 125 seeds were identified by the physics staff for projection of radiation distribution  I have requested : 3D Simulation  I have requested a DVH of the following structures: Prostate and rectum.    ________________________________  Sheral Apley Tammi Klippel, M.D.  This document serves as a record of services personally performed by Tyler Pita, MD. It was created on his behalf by Rae Lips, a trained medical scribe. The creation of this record is based on the scribe's personal observations and the provider's statements to them. This document has been checked and approved by the attending provider.

## 2017-05-06 NOTE — Addendum Note (Signed)
Encounter addended by: Latravious Levitt C, RN on: 05/06/2017 3:15 PM  Actions taken: Charge Capture section accepted

## 2017-05-08 ENCOUNTER — Ambulatory Visit (HOSPITAL_COMMUNITY): Payer: Medicare Other

## 2017-05-08 ENCOUNTER — Ambulatory Visit (HOSPITAL_COMMUNITY)
Admission: RE | Admit: 2017-05-08 | Discharge: 2017-05-08 | Disposition: A | Discharge status: Home / Self Care | Payer: Medicare Other | Source: Ambulatory Visit | Attending: Urology | Admitting: Urology

## 2017-05-08 DIAGNOSIS — C61 Malignant neoplasm of prostate: Secondary | ICD-10-CM | POA: Insufficient documentation

## 2017-05-26 ENCOUNTER — Encounter: Payer: Self-pay | Admitting: Radiation Oncology

## 2017-05-26 ENCOUNTER — Ambulatory Visit: Payer: Medicare Other | Attending: Radiation Oncology | Admitting: Radiation Oncology

## 2017-05-26 DIAGNOSIS — Z510 Encounter for antineoplastic radiation therapy: Secondary | ICD-10-CM | POA: Diagnosis not present

## 2017-05-26 DIAGNOSIS — C61 Malignant neoplasm of prostate: Secondary | ICD-10-CM | POA: Insufficient documentation

## 2017-06-02 ENCOUNTER — Telehealth: Payer: Self-pay | Admitting: Radiation Oncology

## 2017-06-02 NOTE — Telephone Encounter (Signed)
Completed TRIP CANCELLATION/TRIP INTERRUPTION CLAIM FORM. Freeman Caldron, PA-C signed the form. Faxed the completed form to [phone]. Fax confirmation of delivery obtained. Also, mailed original form to [place] [address] [place] [address]. Then, mailed a copy of everything to the patient.

## 2017-06-13 NOTE — Progress Notes (Signed)
  Radiation Oncology         [phone] ________________________________  Name: Brian Hardy MRN: [id number]  Date: 05/26/2017  DOB: 06/22/1935  3D Planning Note   Prostate Brachytherapy Post-Implant Dosimetry  Diagnosis: 82 y.o. gentleman with stage T1c adenocarcinoma of the prostate with a Gleason's score of 3+4 and a PSA of 6.35.  Narrative: On a previous date, Brian Hardy returned following prostate seed implantation for post implant planning. He underwent CT scan complex simulation to delineate the three-dimensional structures of the pelvis and demonstrate the radiation distribution.  Since that time, the seed localization, and complex isodose planning with dose volume histograms have now been completed.  Results:   Prostate Coverage - The dose of radiation delivered to the 90% or more of the prostate gland (D90) was 99.37% of the prescription dose. This exceeds our goal of greater than 90%. Rectal Sparing - The volume of rectal tissue receiving the prescription dose or higher was 0.0 cc. This falls under our thresholds tolerance of 1.0 cc.  Impression: The prostate seed implant appears to show adequate target coverage and appropriate rectal sparing.  Plan:  The patient will continue to follow with urology for ongoing PSA determinations. I would anticipate a high likelihood for local tumor control with minimal risk for rectal morbidity.  ________________________________  Sheral Apley Tammi Klippel, M.D.

## 2018-03-10 IMAGING — RF DG ESOPHAGUS
11 series · 11 of 11 positions shown · non-contrast
Comparison: Barium esophagram September 03, 2010

CLINICAL DATA: Chest discomfort, heartburn, excessive belching.
Family history of gallbladder disease.

EXAM:
ESOPHOGRAM / BARIUM SWALLOW / BARIUM TABLET STUDY
TECHNIQUE: Combined double contrast and single contrast examination performed
using effervescent crystals, thick barium liquid, and thin barium
liquid. The patient was observed with fluoroscopy swallowing a 13 mm
barium sulphate tablet.
FLUOROSCOPY TIME:  Radiation Exposure Index (as provided by the
fluoroscopic device): 27 dGy
Fluoroscopy Time:  1 minute, 6 seconds

[Series 1: run · 1 of 1 slices shown (1 of 11)]
[im 1/1]
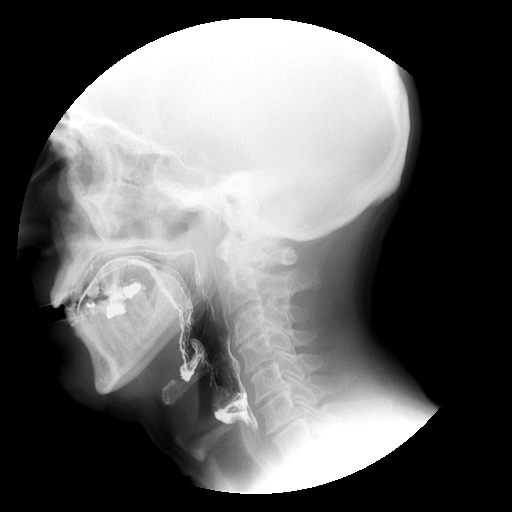

[Series 2: run · 1 of 1 slices shown (2 of 11)]
[im 1/1]
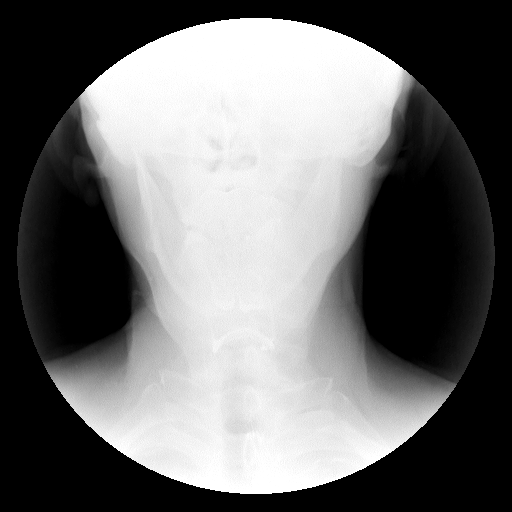

[Series 3: run · 1 of 1 slices shown (3 of 11)]
[im 1/1]
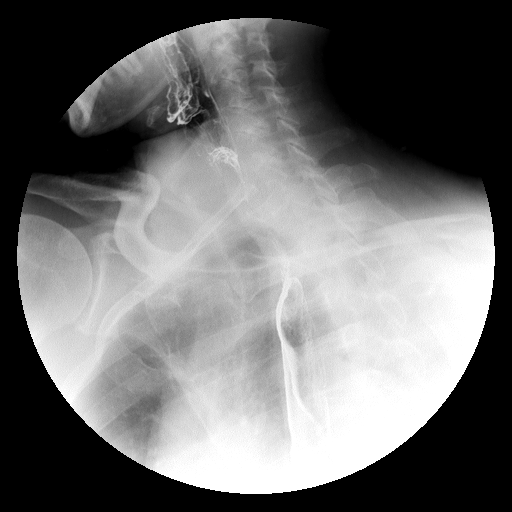

[Series 4: run · 1 of 1 slices shown (4 of 11)]
[im 1/1]
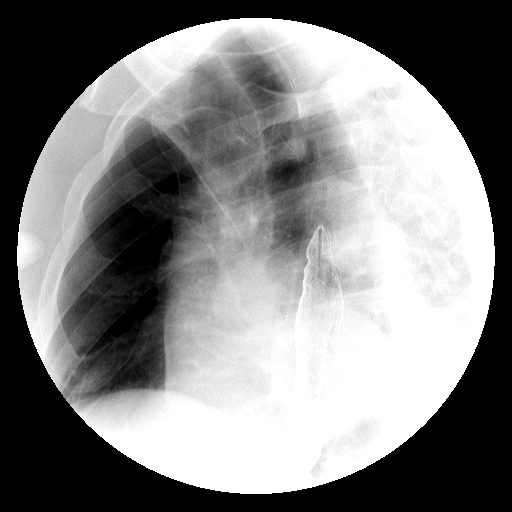

[Series 5: run · 1 of 1 slices shown (5 of 11)]
[im 1/1]
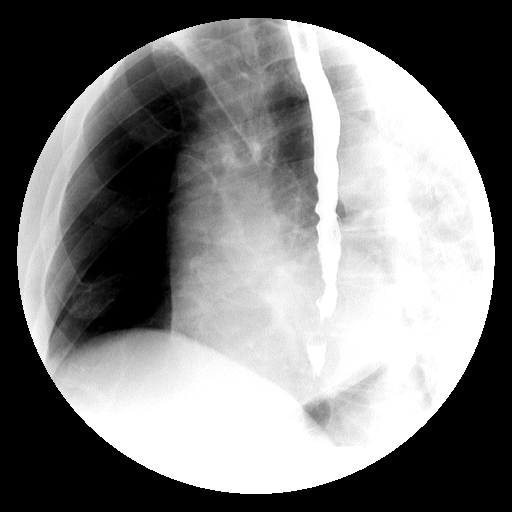

[Series 6: run · 1 of 1 slices shown (6 of 11)]
[im 1/1]
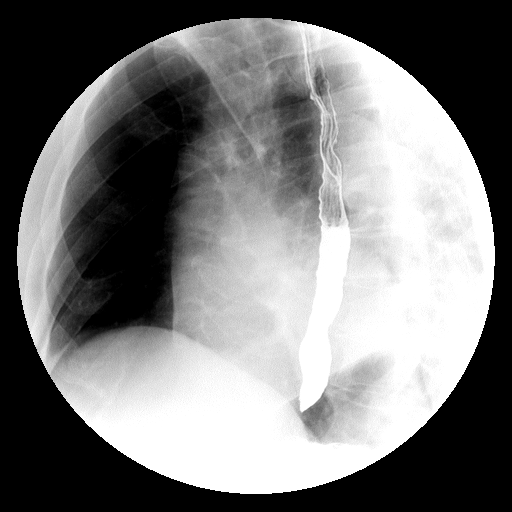

[Series 7: run · 1 of 1 slices shown (7 of 11)]
[im 1/1]
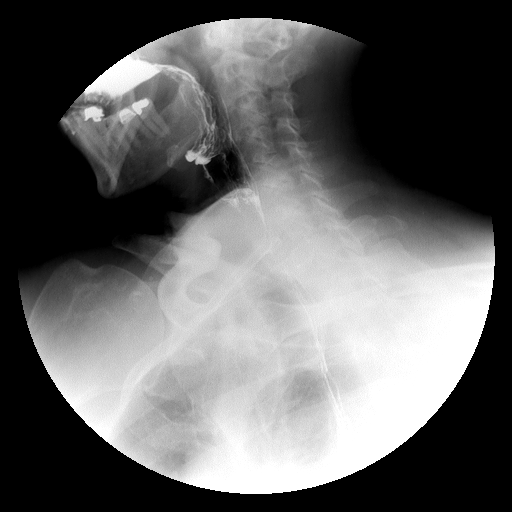

[Series 8: run · 1 of 1 slices shown (8 of 11)]
[im 1/1]
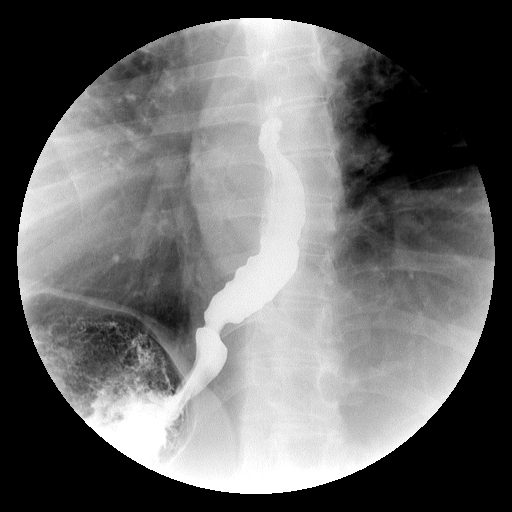

[Series 9: run · 1 of 1 slices shown (9 of 11)]
[im 1/1]
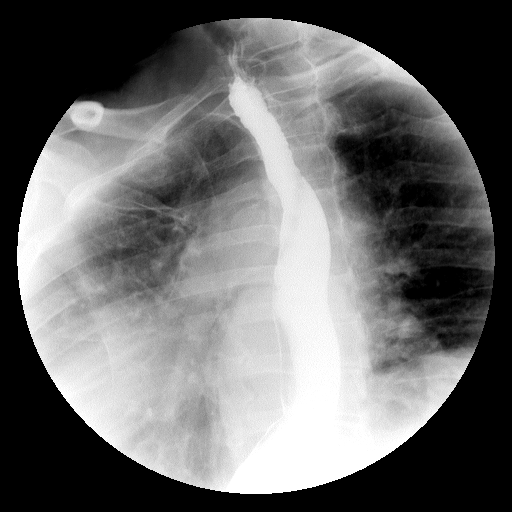

[Series 10: run · 1 of 1 slices shown (10 of 11)]
[im 1/1]
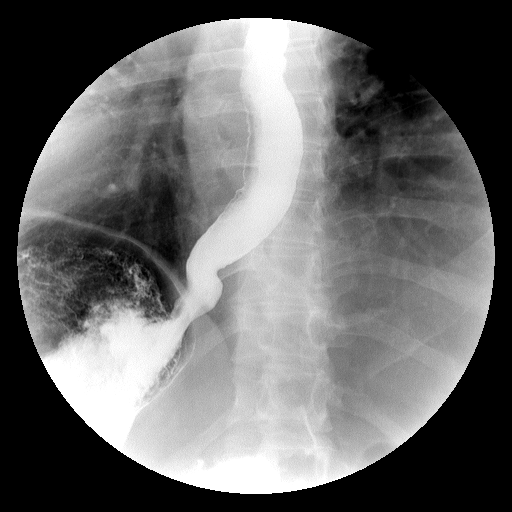

[Series 11: run · 1 of 1 slices shown (11 of 11)]
[im 1/1]
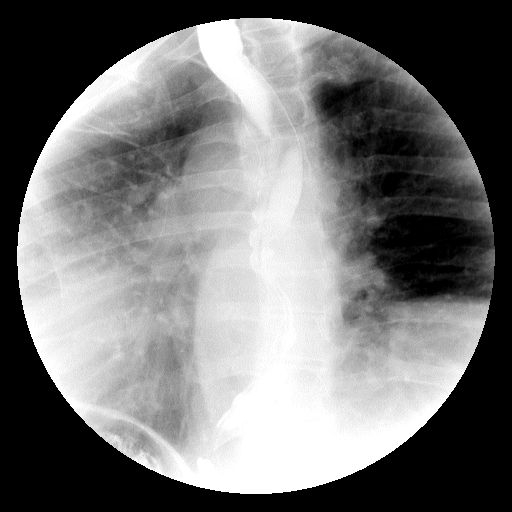

[11 of 11 positions shown; findings below may reference images not displayed]

FINDINGS: The patient ingested the thick and thin barium and the gas-forming
crystals without difficulty. The cervical esophagus distended well.
There was no laryngeal penetration of the barium. No cricopharyngeus
muscle impression was observed.

The thoracic esophagus distended well. There were moderate tertiary
contractions in the distal third of the esophagus. There was no
annular constricting lesion. There was a small reducible hiatal
hernia. No reflux was observed. The barium tablet passed promptly
from the mouth into the stomach.

Of note is considerable food within the stomach from a recently
ingested lunch.
IMPRESSION: Mild changes of presbyesophagus. No evidence of stricture, reflux,
or esophagitis.

Given the patient's symptoms and family history, gallbladder
ultrasound would be a useful next imaging step.

## 2018-04-03 IMAGING — US US ABDOMEN LIMITED
1 series · 14 of 25 positions shown · non-contrast
Comparison: None.

CLINICAL DATA: Chest pressure with nausea for 2 months. Evaluate
for cholelithiasis.

EXAM:
US ABDOMEN LIMITED - RIGHT UPPER QUADRANT

[Series 1: us abdomen limited · 0.18mm/px · 14 of 47 slices shown]
[im 1/47]
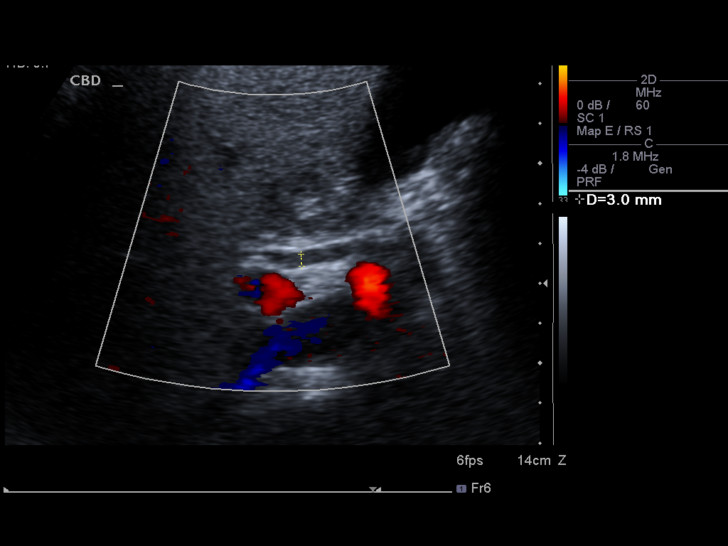
[im 4/47]
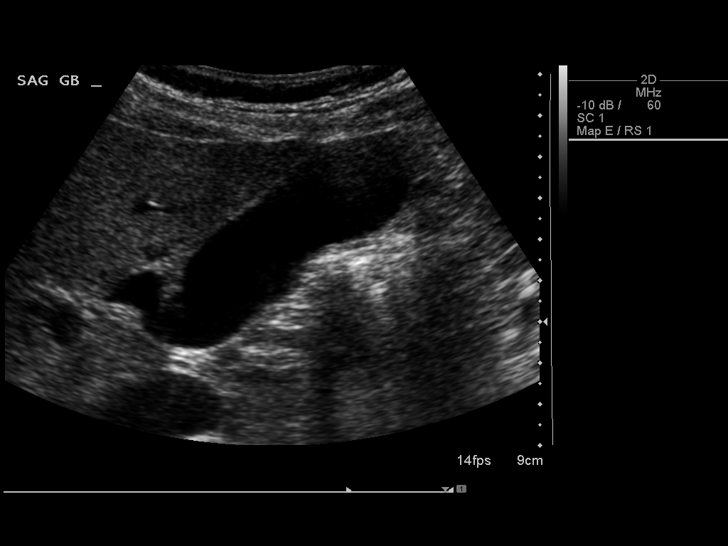
[im 8/47]
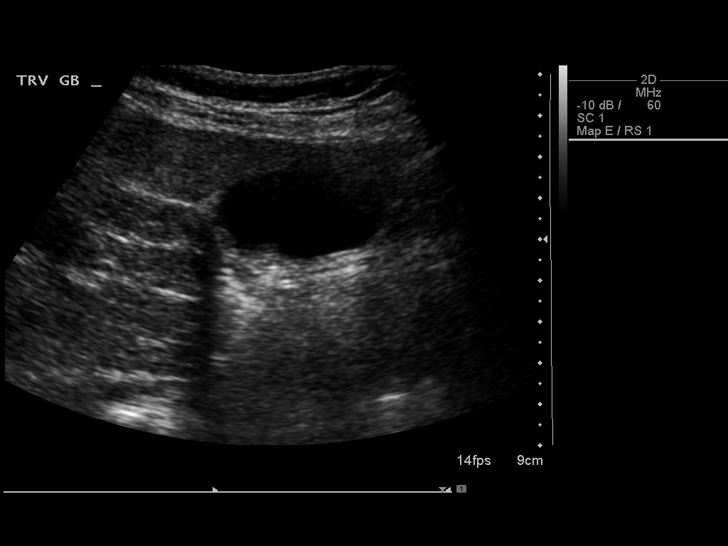
[im 12/47]
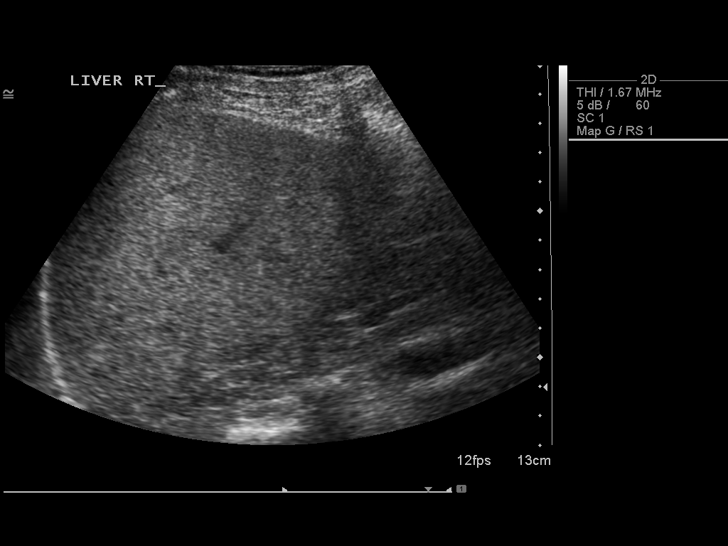
[im 16/47]
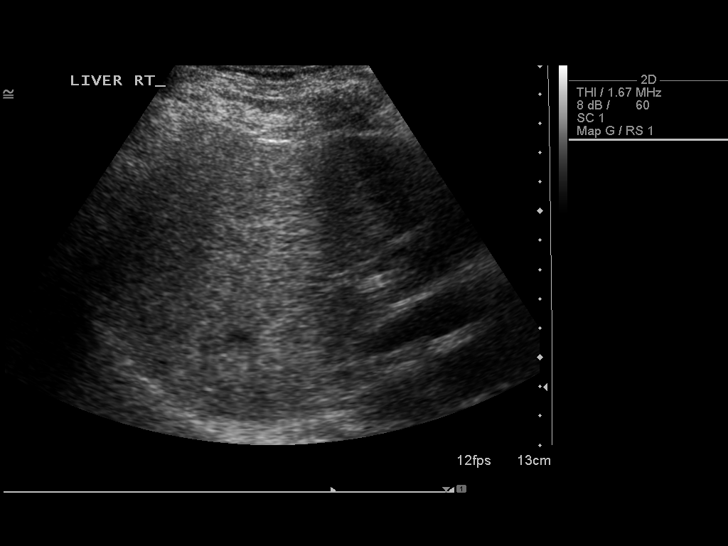
[im 18/47]
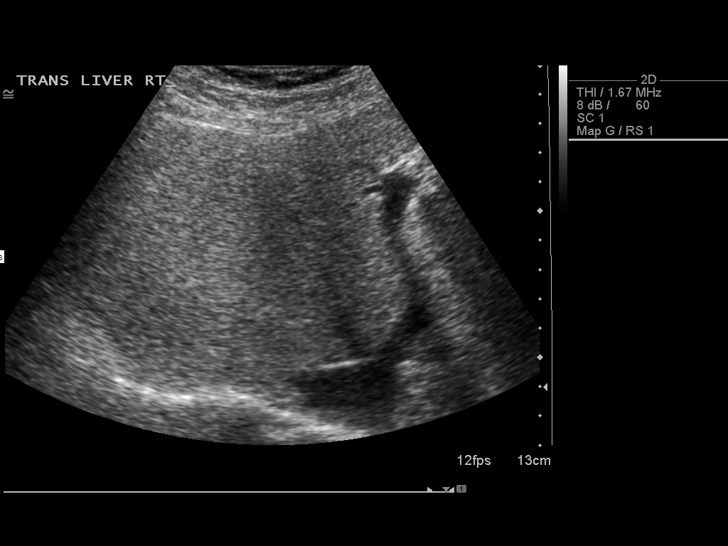
[im 22/47]
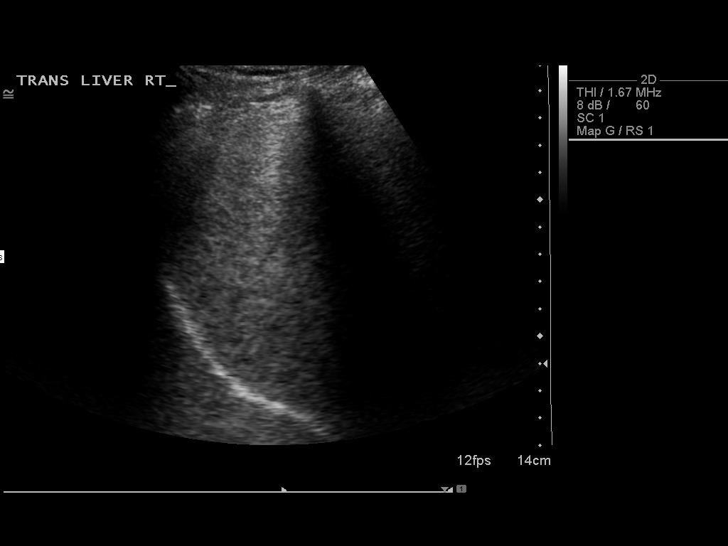
[im 25/47]
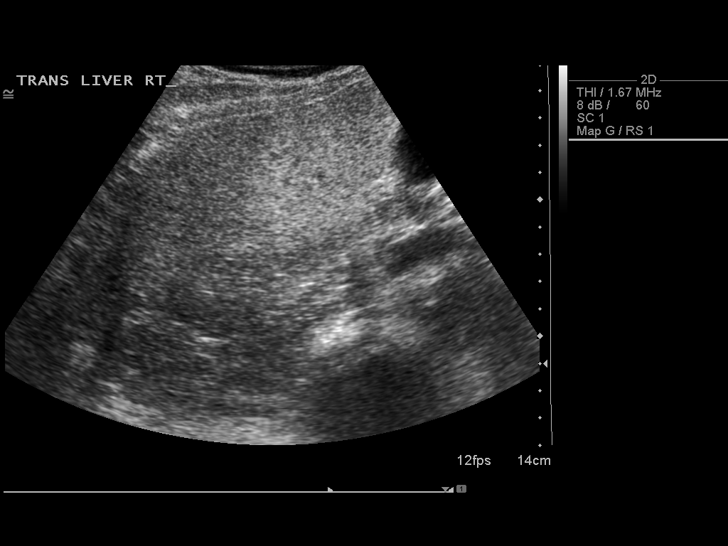
[im 29/47]
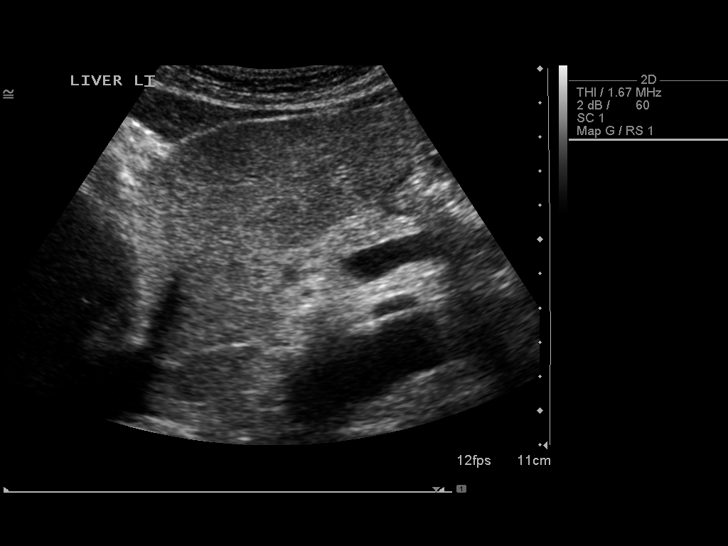
[im 31/47]
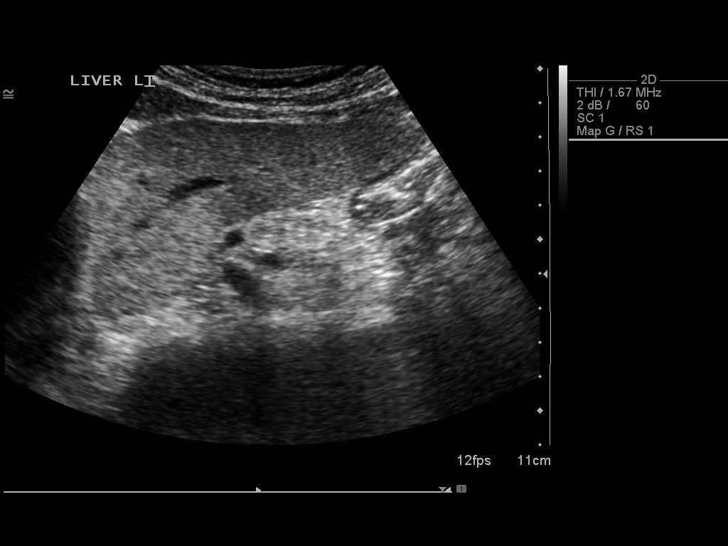
[im 35/47]
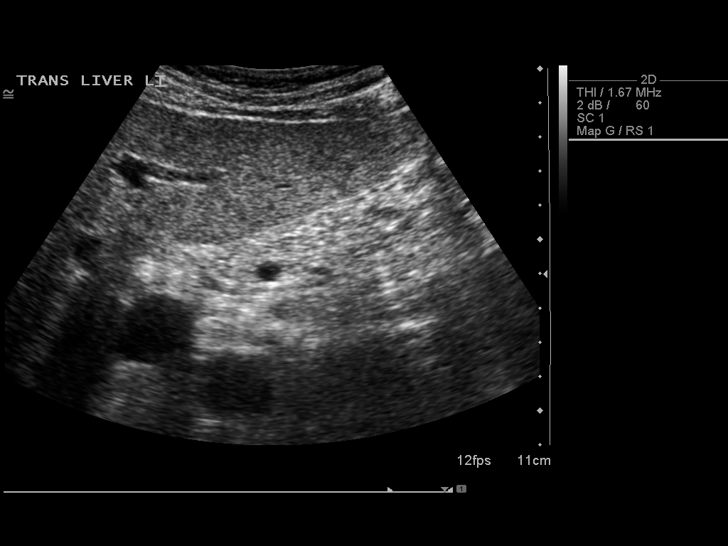
[im 39/47]
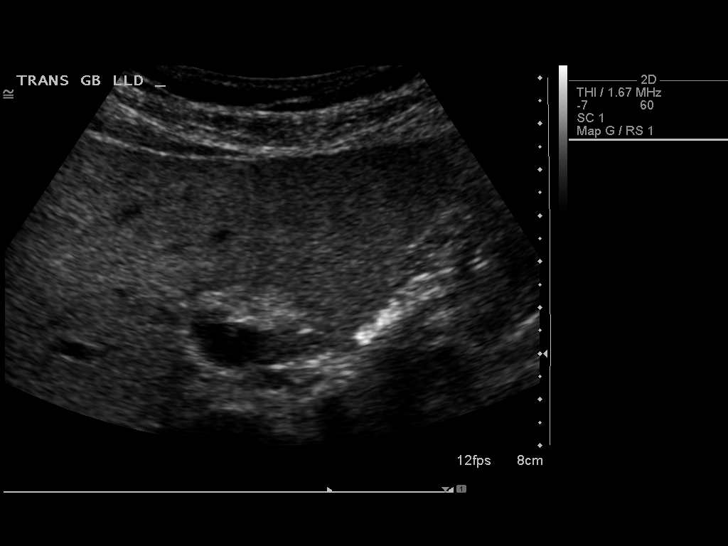
[im 43/47]
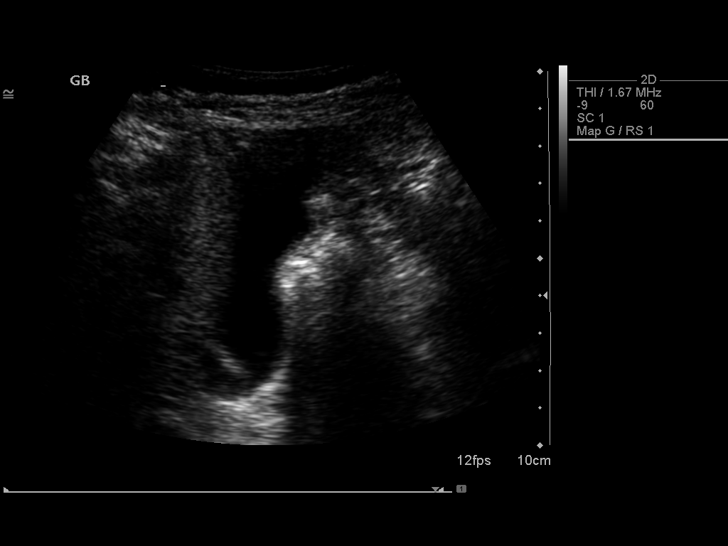
[im 47/47]
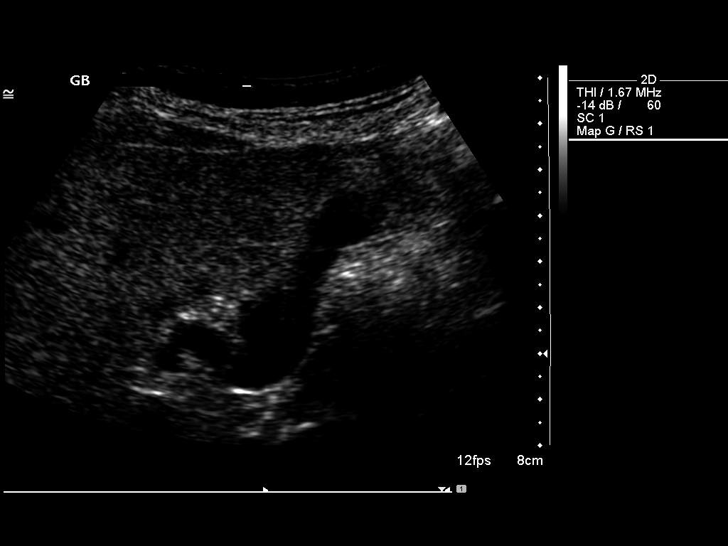

[14 of 25 positions shown; findings below may reference images not displayed]

FINDINGS: Gallbladder:

No gallstones or wall thickening visualized. No sonographic Murphy
sign noted by sonographer.

Common bile duct:

Diameter: 3 mm

Liver:

The hepatic echogenicity is mildly heterogeneous and echogenic. No
focal lesions are identified. No evidence of ascites.
IMPRESSION: 1. No evidence of cholelithiasis, cholecystitis or biliary
dilatation.
2. Mildly heterogeneous hepatic echotexture, nonspecific.

## 2018-11-27 IMAGING — RF DG C-ARM 61-120 MIN
1 series · 2 of 2 positions shown · non-contrast
Comparison: Lumbar spine MR dated 04/01/2015 and radiographs dated
03/20/2015.

CLINICAL DATA: Lumbar spine surgery.

EXAM:
LUMBAR SPINE - 2-3 VIEW; DG C-ARM 61-120 MIN

[Series 1: run · 2 of 2 slices shown]
[im 1/2]
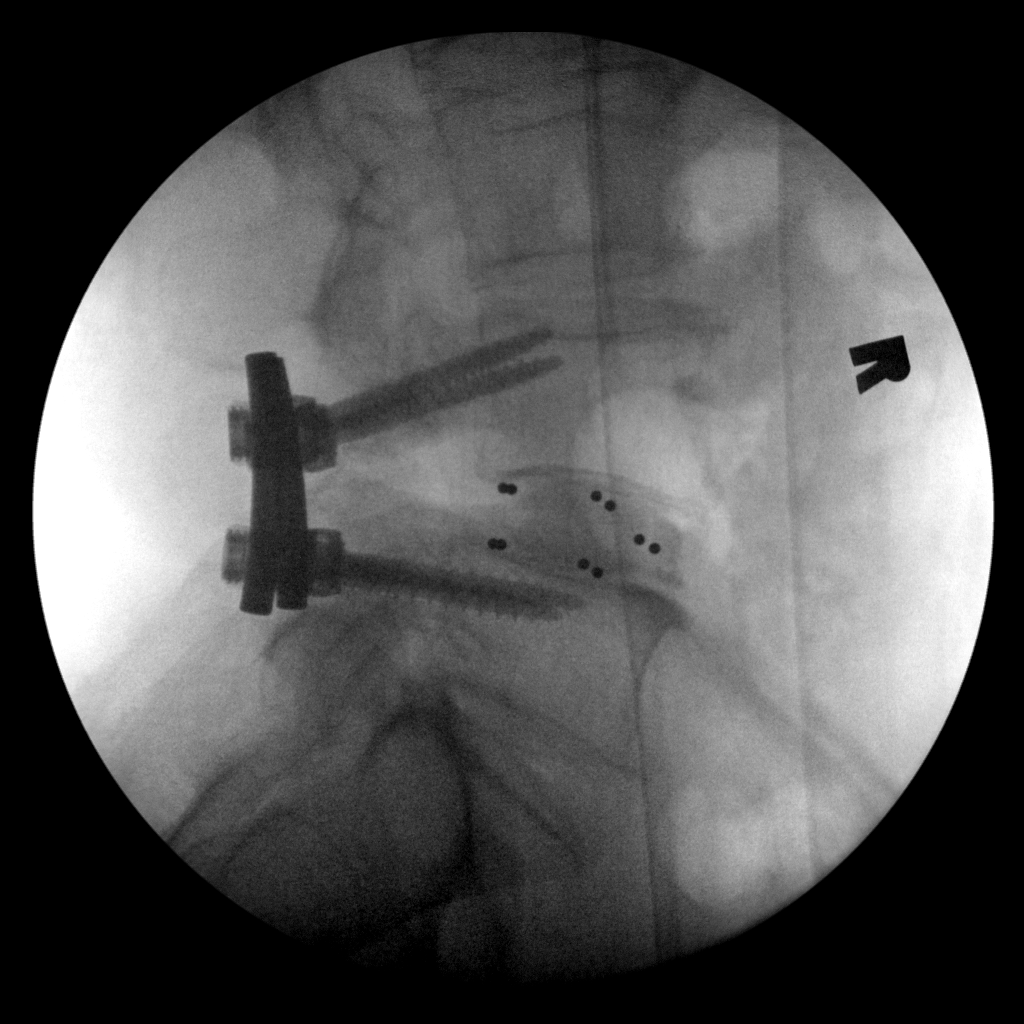
[im 2/2]
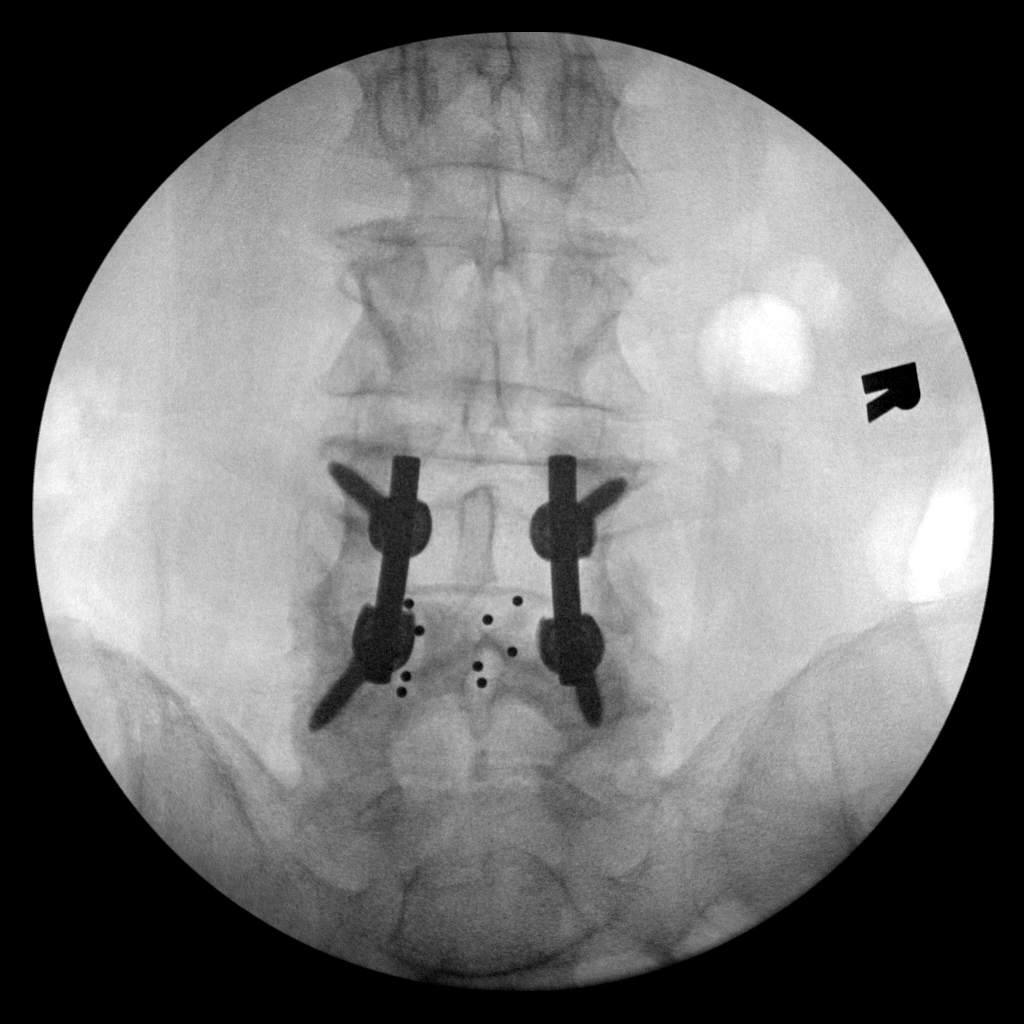

[2 of 2 positions shown; findings below may reference images not displayed]

FINDINGS: Interval interbody and pedicle screw and rod fixation at the L4-5
level. No significant change in minimal anterolisthesis at that
level.
IMPRESSION: Hardware and interbody fusion at the L4-5 level with stable minimal
anterolisthesis.

## 2020-03-28 DIAGNOSIS — H5203 Hypermetropia, bilateral: Secondary | ICD-10-CM | POA: Diagnosis not present

## 2020-03-28 DIAGNOSIS — H2513 Age-related nuclear cataract, bilateral: Secondary | ICD-10-CM | POA: Diagnosis not present

## 2020-03-28 DIAGNOSIS — H25013 Cortical age-related cataract, bilateral: Secondary | ICD-10-CM | POA: Diagnosis not present

## 2020-03-28 DIAGNOSIS — H524 Presbyopia: Secondary | ICD-10-CM | POA: Diagnosis not present

## 2020-05-16 DIAGNOSIS — R1032 Left lower quadrant pain: Secondary | ICD-10-CM | POA: Diagnosis not present

## 2020-07-17 DIAGNOSIS — C61 Malignant neoplasm of prostate: Secondary | ICD-10-CM | POA: Diagnosis not present

## 2020-08-08 DIAGNOSIS — Z8546 Personal history of malignant neoplasm of prostate: Secondary | ICD-10-CM | POA: Diagnosis not present

## 2020-08-08 DIAGNOSIS — N400 Enlarged prostate without lower urinary tract symptoms: Secondary | ICD-10-CM | POA: Diagnosis not present

## 2020-09-09 DIAGNOSIS — D1801 Hemangioma of skin and subcutaneous tissue: Secondary | ICD-10-CM | POA: Diagnosis not present

## 2020-09-09 DIAGNOSIS — B354 Tinea corporis: Secondary | ICD-10-CM | POA: Diagnosis not present

## 2020-09-09 DIAGNOSIS — L821 Other seborrheic keratosis: Secondary | ICD-10-CM | POA: Diagnosis not present

## 2020-09-09 DIAGNOSIS — L820 Inflamed seborrheic keratosis: Secondary | ICD-10-CM | POA: Diagnosis not present

## 2020-09-19 DIAGNOSIS — Z8546 Personal history of malignant neoplasm of prostate: Secondary | ICD-10-CM | POA: Diagnosis not present

## 2020-09-19 DIAGNOSIS — Z0000 Encounter for general adult medical examination without abnormal findings: Secondary | ICD-10-CM | POA: Diagnosis not present

## 2020-09-19 DIAGNOSIS — Z136 Encounter for screening for cardiovascular disorders: Secondary | ICD-10-CM | POA: Diagnosis not present

## 2020-09-19 DIAGNOSIS — Z1389 Encounter for screening for other disorder: Secondary | ICD-10-CM | POA: Diagnosis not present

## 2020-09-19 DIAGNOSIS — R7309 Other abnormal glucose: Secondary | ICD-10-CM | POA: Diagnosis not present

## 2020-09-19 DIAGNOSIS — M519 Unspecified thoracic, thoracolumbar and lumbosacral intervertebral disc disorder: Secondary | ICD-10-CM | POA: Diagnosis not present

## 2020-09-19 DIAGNOSIS — Z13220 Encounter for screening for lipoid disorders: Secondary | ICD-10-CM | POA: Diagnosis not present

## 2020-09-19 DIAGNOSIS — N400 Enlarged prostate without lower urinary tract symptoms: Secondary | ICD-10-CM | POA: Diagnosis not present

## 2020-11-08 DIAGNOSIS — R030 Elevated blood-pressure reading, without diagnosis of hypertension: Secondary | ICD-10-CM | POA: Diagnosis not present

## 2020-11-08 DIAGNOSIS — Z791 Long term (current) use of non-steroidal anti-inflammatories (NSAID): Secondary | ICD-10-CM | POA: Diagnosis not present

## 2020-11-08 DIAGNOSIS — N400 Enlarged prostate without lower urinary tract symptoms: Secondary | ICD-10-CM | POA: Diagnosis not present

## 2020-11-08 DIAGNOSIS — Z8249 Family history of ischemic heart disease and other diseases of the circulatory system: Secondary | ICD-10-CM | POA: Diagnosis not present

## 2020-11-08 DIAGNOSIS — Z818 Family history of other mental and behavioral disorders: Secondary | ICD-10-CM | POA: Diagnosis not present

## 2020-11-08 DIAGNOSIS — Z8546 Personal history of malignant neoplasm of prostate: Secondary | ICD-10-CM | POA: Diagnosis not present

## 2020-11-08 DIAGNOSIS — G8929 Other chronic pain: Secondary | ICD-10-CM | POA: Diagnosis not present

## 2020-11-08 DIAGNOSIS — M1990 Unspecified osteoarthritis, unspecified site: Secondary | ICD-10-CM | POA: Diagnosis not present

## 2020-11-08 DIAGNOSIS — Z87891 Personal history of nicotine dependence: Secondary | ICD-10-CM | POA: Diagnosis not present

## 2021-04-03 DIAGNOSIS — H2513 Age-related nuclear cataract, bilateral: Secondary | ICD-10-CM | POA: Diagnosis not present

## 2021-04-03 DIAGNOSIS — H5203 Hypermetropia, bilateral: Secondary | ICD-10-CM | POA: Diagnosis not present

## 2021-04-03 DIAGNOSIS — H04123 Dry eye syndrome of bilateral lacrimal glands: Secondary | ICD-10-CM | POA: Diagnosis not present

## 2021-04-03 DIAGNOSIS — H25013 Cortical age-related cataract, bilateral: Secondary | ICD-10-CM | POA: Diagnosis not present

## 2021-08-01 DIAGNOSIS — C61 Malignant neoplasm of prostate: Secondary | ICD-10-CM | POA: Diagnosis not present

## 2021-08-08 DIAGNOSIS — Z8546 Personal history of malignant neoplasm of prostate: Secondary | ICD-10-CM | POA: Diagnosis not present

## 2021-09-23 DIAGNOSIS — R7303 Prediabetes: Secondary | ICD-10-CM | POA: Diagnosis not present

## 2021-09-23 DIAGNOSIS — M519 Unspecified thoracic, thoracolumbar and lumbosacral intervertebral disc disorder: Secondary | ICD-10-CM | POA: Diagnosis not present

## 2021-09-23 DIAGNOSIS — R030 Elevated blood-pressure reading, without diagnosis of hypertension: Secondary | ICD-10-CM | POA: Diagnosis not present

## 2021-09-23 DIAGNOSIS — Z136 Encounter for screening for cardiovascular disorders: Secondary | ICD-10-CM | POA: Diagnosis not present

## 2021-09-23 DIAGNOSIS — Z0000 Encounter for general adult medical examination without abnormal findings: Secondary | ICD-10-CM | POA: Diagnosis not present

## 2021-09-23 DIAGNOSIS — Z8546 Personal history of malignant neoplasm of prostate: Secondary | ICD-10-CM | POA: Diagnosis not present

## 2021-09-23 DIAGNOSIS — N400 Enlarged prostate without lower urinary tract symptoms: Secondary | ICD-10-CM | POA: Diagnosis not present

## 2021-09-23 DIAGNOSIS — Z1331 Encounter for screening for depression: Secondary | ICD-10-CM | POA: Diagnosis not present

## 2021-10-28 DIAGNOSIS — R030 Elevated blood-pressure reading, without diagnosis of hypertension: Secondary | ICD-10-CM | POA: Diagnosis not present

## 2022-04-09 DIAGNOSIS — H01004 Unspecified blepharitis left upper eyelid: Secondary | ICD-10-CM | POA: Diagnosis not present

## 2022-04-09 DIAGNOSIS — H25013 Cortical age-related cataract, bilateral: Secondary | ICD-10-CM | POA: Diagnosis not present

## 2022-04-09 DIAGNOSIS — H5203 Hypermetropia, bilateral: Secondary | ICD-10-CM | POA: Diagnosis not present

## 2022-04-09 DIAGNOSIS — H01001 Unspecified blepharitis right upper eyelid: Secondary | ICD-10-CM | POA: Diagnosis not present

## 2022-04-09 DIAGNOSIS — H2513 Age-related nuclear cataract, bilateral: Secondary | ICD-10-CM | POA: Diagnosis not present

## 2022-08-03 DIAGNOSIS — C61 Malignant neoplasm of prostate: Secondary | ICD-10-CM | POA: Diagnosis not present

## 2022-08-03 DIAGNOSIS — Z8546 Personal history of malignant neoplasm of prostate: Secondary | ICD-10-CM | POA: Diagnosis not present

## 2022-08-10 DIAGNOSIS — R3912 Poor urinary stream: Secondary | ICD-10-CM | POA: Diagnosis not present

## 2022-08-10 DIAGNOSIS — Z8546 Personal history of malignant neoplasm of prostate: Secondary | ICD-10-CM | POA: Diagnosis not present

## 2022-08-10 DIAGNOSIS — N401 Enlarged prostate with lower urinary tract symptoms: Secondary | ICD-10-CM | POA: Diagnosis not present

## 2022-08-13 DIAGNOSIS — H6121 Impacted cerumen, right ear: Secondary | ICD-10-CM | POA: Diagnosis not present

## 2022-10-07 DIAGNOSIS — Z0000 Encounter for general adult medical examination without abnormal findings: Secondary | ICD-10-CM | POA: Diagnosis not present

## 2022-10-07 DIAGNOSIS — N400 Enlarged prostate without lower urinary tract symptoms: Secondary | ICD-10-CM | POA: Diagnosis not present

## 2022-10-07 DIAGNOSIS — M519 Unspecified thoracic, thoracolumbar and lumbosacral intervertebral disc disorder: Secondary | ICD-10-CM | POA: Diagnosis not present

## 2022-10-07 DIAGNOSIS — Z8546 Personal history of malignant neoplasm of prostate: Secondary | ICD-10-CM | POA: Diagnosis not present

## 2022-10-07 DIAGNOSIS — Z23 Encounter for immunization: Secondary | ICD-10-CM | POA: Diagnosis not present

## 2022-10-07 DIAGNOSIS — E785 Hyperlipidemia, unspecified: Secondary | ICD-10-CM | POA: Diagnosis not present

## 2022-10-07 DIAGNOSIS — R7303 Prediabetes: Secondary | ICD-10-CM | POA: Diagnosis not present

## 2022-10-07 DIAGNOSIS — Z1331 Encounter for screening for depression: Secondary | ICD-10-CM | POA: Diagnosis not present

## 2023-01-12 DIAGNOSIS — Z809 Family history of malignant neoplasm, unspecified: Secondary | ICD-10-CM | POA: Diagnosis not present

## 2023-01-12 DIAGNOSIS — R7303 Prediabetes: Secondary | ICD-10-CM | POA: Diagnosis not present

## 2023-01-12 DIAGNOSIS — M1990 Unspecified osteoarthritis, unspecified site: Secondary | ICD-10-CM | POA: Diagnosis not present

## 2023-01-12 DIAGNOSIS — Z882 Allergy status to sulfonamides status: Secondary | ICD-10-CM | POA: Diagnosis not present

## 2023-01-12 DIAGNOSIS — R030 Elevated blood-pressure reading, without diagnosis of hypertension: Secondary | ICD-10-CM | POA: Diagnosis not present

## 2023-01-12 DIAGNOSIS — Z8249 Family history of ischemic heart disease and other diseases of the circulatory system: Secondary | ICD-10-CM | POA: Diagnosis not present

## 2023-01-12 DIAGNOSIS — Z87891 Personal history of nicotine dependence: Secondary | ICD-10-CM | POA: Diagnosis not present

## 2023-01-12 DIAGNOSIS — N400 Enlarged prostate without lower urinary tract symptoms: Secondary | ICD-10-CM | POA: Diagnosis not present

## 2023-01-12 DIAGNOSIS — Z818 Family history of other mental and behavioral disorders: Secondary | ICD-10-CM | POA: Diagnosis not present

## 2023-04-12 DIAGNOSIS — H25013 Cortical age-related cataract, bilateral: Secondary | ICD-10-CM | POA: Diagnosis not present

## 2023-04-12 DIAGNOSIS — H01004 Unspecified blepharitis left upper eyelid: Secondary | ICD-10-CM | POA: Diagnosis not present

## 2023-04-12 DIAGNOSIS — H01001 Unspecified blepharitis right upper eyelid: Secondary | ICD-10-CM | POA: Diagnosis not present

## 2023-04-12 DIAGNOSIS — H2513 Age-related nuclear cataract, bilateral: Secondary | ICD-10-CM | POA: Diagnosis not present

## 2023-04-12 DIAGNOSIS — H5203 Hypermetropia, bilateral: Secondary | ICD-10-CM | POA: Diagnosis not present

## 2023-07-13 DIAGNOSIS — H25011 Cortical age-related cataract, right eye: Secondary | ICD-10-CM | POA: Diagnosis not present

## 2023-07-13 DIAGNOSIS — H25811 Combined forms of age-related cataract, right eye: Secondary | ICD-10-CM | POA: Diagnosis not present

## 2023-07-13 DIAGNOSIS — H2511 Age-related nuclear cataract, right eye: Secondary | ICD-10-CM | POA: Diagnosis not present

## 2023-07-13 DIAGNOSIS — Z961 Presence of intraocular lens: Secondary | ICD-10-CM | POA: Diagnosis not present

## 2023-07-27 DIAGNOSIS — H25012 Cortical age-related cataract, left eye: Secondary | ICD-10-CM | POA: Diagnosis not present

## 2023-07-27 DIAGNOSIS — H2512 Age-related nuclear cataract, left eye: Secondary | ICD-10-CM | POA: Diagnosis not present

## 2023-07-27 DIAGNOSIS — Z961 Presence of intraocular lens: Secondary | ICD-10-CM | POA: Diagnosis not present

## 2023-07-27 DIAGNOSIS — H25812 Combined forms of age-related cataract, left eye: Secondary | ICD-10-CM | POA: Diagnosis not present

## 2023-08-05 DIAGNOSIS — N401 Enlarged prostate with lower urinary tract symptoms: Secondary | ICD-10-CM | POA: Diagnosis not present

## 2023-08-12 DIAGNOSIS — R3912 Poor urinary stream: Secondary | ICD-10-CM | POA: Diagnosis not present

## 2023-08-12 DIAGNOSIS — N401 Enlarged prostate with lower urinary tract symptoms: Secondary | ICD-10-CM | POA: Diagnosis not present

## 2023-08-12 DIAGNOSIS — Z8546 Personal history of malignant neoplasm of prostate: Secondary | ICD-10-CM | POA: Diagnosis not present

## 2023-09-03 DIAGNOSIS — Z961 Presence of intraocular lens: Secondary | ICD-10-CM | POA: Diagnosis not present

## 2023-10-28 DIAGNOSIS — Z23 Encounter for immunization: Secondary | ICD-10-CM | POA: Diagnosis not present

## 2023-10-28 DIAGNOSIS — N400 Enlarged prostate without lower urinary tract symptoms: Secondary | ICD-10-CM | POA: Diagnosis not present

## 2023-10-28 DIAGNOSIS — R7303 Prediabetes: Secondary | ICD-10-CM | POA: Diagnosis not present

## 2023-10-28 DIAGNOSIS — Z0000 Encounter for general adult medical examination without abnormal findings: Secondary | ICD-10-CM | POA: Diagnosis not present

## 2023-10-28 DIAGNOSIS — I459 Conduction disorder, unspecified: Secondary | ICD-10-CM | POA: Diagnosis not present

## 2023-10-28 DIAGNOSIS — Z8546 Personal history of malignant neoplasm of prostate: Secondary | ICD-10-CM | POA: Diagnosis not present

## 2023-10-28 DIAGNOSIS — Z1331 Encounter for screening for depression: Secondary | ICD-10-CM | POA: Diagnosis not present

## 2023-10-28 DIAGNOSIS — M519 Unspecified thoracic, thoracolumbar and lumbosacral intervertebral disc disorder: Secondary | ICD-10-CM | POA: Diagnosis not present
